# Patient Record
Sex: Female | Born: 1955 | Race: White | Hispanic: No | Marital: Married | Smoking: Former smoker
Health system: Southern US, Community
[De-identification: ages and names within clinical notes are randomized; demographics above are authoritative.]

## PROBLEM LIST (undated history)

## (undated) DIAGNOSIS — I10 Essential (primary) hypertension: Secondary | ICD-10-CM

## (undated) DIAGNOSIS — E785 Hyperlipidemia, unspecified: Secondary | ICD-10-CM

## (undated) HISTORY — DX: Hyperlipidemia, unspecified: E78.5

## (undated) HISTORY — PX: OTHER SURGICAL HISTORY: SHX169

## (undated) HISTORY — DX: Essential (primary) hypertension: I10

## (undated) HISTORY — PX: KNEE CARTILAGE SURGERY: SHX688

---

## 1993-02-14 HISTORY — PX: ABDOMINAL HYSTERECTOMY: SHX81

## 1997-02-14 HISTORY — PX: CHOLECYSTECTOMY: SHX55

## 2009-02-14 LAB — HM COLONOSCOPY

## 2010-07-08 ENCOUNTER — Ambulatory Visit (INDEPENDENT_AMBULATORY_CARE_PROVIDER_SITE_OTHER): Payer: 59 | Admitting: Family Medicine

## 2010-07-08 ENCOUNTER — Encounter: Payer: Self-pay | Admitting: Family Medicine

## 2010-07-08 DIAGNOSIS — Z0000 Encounter for general adult medical examination without abnormal findings: Secondary | ICD-10-CM | POA: Insufficient documentation

## 2010-07-08 DIAGNOSIS — I10 Essential (primary) hypertension: Secondary | ICD-10-CM

## 2010-07-08 LAB — LIPID PANEL
Cholesterol: 227 mg/dL — ABNORMAL HIGH (ref 0–200)
VLDL: 51.2 mg/dL — ABNORMAL HIGH (ref 0.0–40.0)

## 2010-07-08 LAB — CBC WITH DIFFERENTIAL/PLATELET
Basophils Relative: 0.6 % (ref 0.0–3.0)
Eosinophils Absolute: 0.2 10*3/uL (ref 0.0–0.7)
Eosinophils Relative: 2.3 % (ref 0.0–5.0)
Hemoglobin: 13.8 g/dL (ref 12.0–15.0)
Lymphocytes Relative: 36.7 % (ref 12.0–46.0)
MCHC: 35.4 g/dL (ref 30.0–36.0)
Monocytes Relative: 7.7 % (ref 3.0–12.0)
Neutro Abs: 3.8 10*3/uL (ref 1.4–7.7)
Neutrophils Relative %: 52.7 % (ref 43.0–77.0)
RBC: 4.31 Mil/uL (ref 3.87–5.11)
WBC: 7.2 10*3/uL (ref 4.5–10.5)

## 2010-07-08 LAB — BASIC METABOLIC PANEL
BUN: 15 mg/dL (ref 6–23)
CO2: 28 mEq/L (ref 19–32)
Chloride: 105 mEq/L (ref 96–112)
Creatinine, Ser: 0.6 mg/dL (ref 0.4–1.2)
Glucose, Bld: 108 mg/dL — ABNORMAL HIGH (ref 70–99)
Potassium: 4.4 mEq/L (ref 3.5–5.1)

## 2010-07-08 LAB — HEPATIC FUNCTION PANEL
ALT: 29 U/L (ref 0–35)
AST: 24 U/L (ref 0–37)
Albumin: 4.2 g/dL (ref 3.5–5.2)
Total Protein: 7.2 g/dL (ref 6.0–8.3)

## 2010-07-08 LAB — TSH: TSH: 1.01 u[IU]/mL (ref 0.35–5.50)

## 2010-07-08 LAB — LDL CHOLESTEROL, DIRECT: Direct LDL: 159.6 mg/dL

## 2010-07-08 MED ORDER — HYDROCHLOROTHIAZIDE 12.5 MG PO CAPS: 12.5000 mg | ORAL_CAPSULE | Freq: Every day | ORAL | Status: DC

## 2010-07-08 NOTE — Patient Instructions (Signed)
Follow up in 1 month to recheck blood pressure We'll notify you of your lab results Start the HCTZ daily for blood pressure Increase your water intake Try and limit your salt Call with any questions or concerns HAVE A WONDERFUL TRIP!!!

## 2010-07-08 NOTE — Progress Notes (Signed)
  Subjective:    Patient ID: Sherry Ross, female    DOB: [birth year], 55 y.o.   MRN: [id number]  HPI New to establish.  Previous MD- [facility].  GYN- [facility].  UTD on pap/mammo.  Colonoscopy 2011.  Elevated BP- pt reports this has been borderline for ~1 yr.  BP at recent health fair was 'elevated in 1 arm but normal in the other'.  Hyperlipidemia- was attempting to control w/ diet and exercise but she feels 'this didn't pan out'.   Review of Systems Patient reports no vision/ hearing  changes, adenopathy,fever, weight change,  persistant / recurrent hoarseness , swallowing issues, chest pain,palpitations,edema,persistant /recurrent cough, hemoptysis, dyspnea( rest/ exertional/paroxysmal nocturnal), gastrointestinal bleeding(melena, rectal bleeding), abdominal pain, significant heartburn, bowel changes,GU symptoms(dysuria, hematuria,pyuria, incontinence) ), Gyn symptoms(abnormal  bleeding , pain),  syncope, focal weakness, memory loss,numbness & tingling, skin/hair /nail changes,abnormal bruising or bleeding, anxiety,or depression.     Objective:   Physical Exam  General Appearance:    Alert, cooperative, no distress, appears stated age  Head:    Normocephalic, without obvious abnormality, atraumatic  Eyes:    PERRL, conjunctiva/corneas clear, EOM's intact, fundi    benign, both eyes  Ears:    Normal TM's and external ear canals, both ears  Nose:   Nares normal, septum midline, mucosa normal, no drainage    or sinus tenderness  Throat:   Lips, mucosa, and tongue normal; teeth and gums normal  Neck:   Supple, symmetrical, trachea midline, no adenopathy;    Thyroid: no enlargement/tenderness/nodules  Back:     Symmetric, no curvature, ROM normal, no CVA tenderness  Lungs:     Clear to auscultation bilaterally, respirations unlabored  Chest Wall:    No tenderness or deformity   Heart:    Regular rate and rhythm, S1 and S2 normal, no murmur, rub   or gallop  Breast Exam:    Deferred to  GYN  Abdomen:     Soft, non-tender, bowel sounds active all four quadrants,    no masses, no organomegaly  Genitalia:    Deferred to gyn     Extremities:   Extremities normal, atraumatic, no cyanosis or edema  Pulses:   2+ and symmetric all extremities  Skin:   Skin color, texture, turgor normal, no rashes or lesions  Lymph nodes:   Cervical, supraclavicular, and axillary nodes normal  Neurologic:   CNII-XII intact, normal strength, sensation and reflexes    throughout          Assessment & Plan:

## 2010-07-09 ENCOUNTER — Encounter: Payer: Self-pay | Admitting: *Deleted

## 2010-07-09 ENCOUNTER — Telehealth: Payer: Self-pay | Admitting: *Deleted

## 2010-07-09 MED ORDER — ATORVASTATIN CALCIUM 10 MG PO TABS: 10.0000 mg | ORAL_TABLET | Freq: Every day | ORAL | Status: DC

## 2010-07-09 NOTE — Telephone Encounter (Addendum)
Discuss with patient  Message copied by Verdene Rio on Fri Jul 09, 2010  2:10 PM ------      Message from: Sheliah Hatch      Created: Thu Jul 08, 2010  4:59 PM       Total cholesterol, LDL, triglycerides are all elevated.  This will improve w/ healthy diet and regular exercise but in addition to this should start Lipitor 10mg  nightly and recheck LFTs 8 weeks after starting meds.  (can start this after she returns from [place])            Sugar is also mildly elevated.  This will also improve w/ attention to diet and exercise.

## 2010-07-10 LAB — VITAMIN D 1,25 DIHYDROXY
Vitamin D2 1, 25 (OH)2: <8 pg/mL
Vitamin D3 1, 25 (OH)2: 55 pg/mL

## 2010-07-13 ENCOUNTER — Encounter: Payer: Self-pay | Admitting: *Deleted

## 2010-07-14 NOTE — Telephone Encounter (Signed)
Please let her know that due to oral surgery i cannot call her (she would not be able to understand me over the phone).  Please tell her that she can relay her problem to our nursing staff and then we will help her from there.

## 2010-07-14 NOTE — Telephone Encounter (Signed)
Pt aware and has since filled Rx and started med.

## 2010-07-14 NOTE — Telephone Encounter (Signed)
Muscle pain and breakdown is a very rare side effect and unlikely w/ such a low dose of medicine.  She can try OTC fish oil and red yeast rice to treat her trigs and cholesterol respectively along w/ attention to diet and regular exercise.  Will recheck labs in 6 months- if still elevated will need prescription meds.

## 2010-07-14 NOTE — Telephone Encounter (Signed)
Pt states that she is concern about med causing breakdown in muscle and pain. Pt would like to know if there are any other alternative that she can try that do not have so many side effects.Please advise

## 2010-07-14 NOTE — Telephone Encounter (Signed)
Left message to call office

## 2010-07-18 NOTE — Assessment & Plan Note (Signed)
BP elevation qualifies pt as hypertensive.  Start low dose HCTZ and recheck when pt return from vacation.

## 2010-07-18 NOTE — Assessment & Plan Note (Signed)
Pt's PE WNL  UTD on health maintenance.  Anticipatory guidance provided. 

## 2010-08-10 ENCOUNTER — Ambulatory Visit (INDEPENDENT_AMBULATORY_CARE_PROVIDER_SITE_OTHER): Payer: 59 | Admitting: Family Medicine

## 2010-08-10 ENCOUNTER — Encounter: Payer: Self-pay | Admitting: Family Medicine

## 2010-08-10 DIAGNOSIS — E785 Hyperlipidemia, unspecified: Secondary | ICD-10-CM

## 2010-08-10 DIAGNOSIS — I10 Essential (primary) hypertension: Secondary | ICD-10-CM

## 2010-08-10 DIAGNOSIS — M722 Plantar fascial fibromatosis: Secondary | ICD-10-CM | POA: Insufficient documentation

## 2010-08-10 LAB — BASIC METABOLIC PANEL
BUN: 16 mg/dL (ref 6–23)
Chloride: 105 mEq/L (ref 96–112)
Creatinine, Ser: 0.6 mg/dL (ref 0.4–1.2)
GFR: 116.62 mL/min (ref >60.00)
Potassium: 4.1 mEq/L (ref 3.5–5.1)

## 2010-08-10 LAB — HEPATIC FUNCTION PANEL
Alkaline Phosphatase: 67 U/L (ref 39–117)
Bilirubin, Direct: 0.1 mg/dL (ref 0.0–0.3)
Total Bilirubin: 0.9 mg/dL (ref 0.3–1.2)
Total Protein: 6.9 g/dL (ref 6.0–8.3)

## 2010-08-10 MED ORDER — NAPROXEN 500 MG PO TABS: 500.0000 mg | ORAL_TABLET | Freq: Two times a day (BID) | ORAL | Status: DC

## 2010-08-10 NOTE — Assessment & Plan Note (Signed)
BP much better controlled today.  Asymptomatic.  No side effects from meds.  No changes at this time.

## 2010-08-10 NOTE — Progress Notes (Signed)
  Subjective:    Patient ID: Sherry Ross, female    DOB: 06-26-1955, 55 y.o.   MRN: [id number]  HPI HTN- new problem for pt, currently on HCTZ.  No complaints about med.  Denies CP, SOB, HAs, visual changes, edema.  Hyperlipidemia- new problem for pt, started on Lipitor.  No complaints re meds.  No N/V, abd pain, myalgias  Bilateral heel pain- L>R, worse in flat shoes and w/ 1st steps.  sxs started 'quite some time' ago but has recently worsened.   Review of Systems For ROS see HPI    Objective:   Physical Exam  Constitutional: She is oriented to person, place, and time. She appears well-developed and well-nourished. No distress.  HENT:  Head: Normocephalic and atraumatic.  Eyes: Conjunctivae and EOM are normal. Pupils are equal, round, and reactive to light.  Neck: Normal range of motion. Neck supple. No thyromegaly present.  Cardiovascular: Normal rate, regular rhythm, normal heart sounds and intact distal pulses.   No murmur heard. Pulmonary/Chest: Effort normal and breath sounds normal. No respiratory distress.  Abdominal: Soft. She exhibits no distension. There is no tenderness.  Musculoskeletal: She exhibits no edema.       No TTP over insertion of plantar fascia bilaterally Normal longitudinal arches bilaterally w/ minimal flattening w/ weight bearing.  Lymphadenopathy:    She has no cervical adenopathy.  Neurological: She is alert and oriented to person, place, and time.  Skin: Skin is warm and dry.  Psychiatric: She has a normal mood and affect. Her behavior is normal.          Assessment & Plan:

## 2010-08-10 NOTE — Assessment & Plan Note (Signed)
Pt reports this has been going on for ~6 months.  Will start NSAIDs, ice, stretching, and reviewed the importance of supportive footwear.  Reviewed supportive care and red flags that should prompt return.  Pt expressed understanding and is in agreement w/ plan.

## 2010-08-10 NOTE — Patient Instructions (Signed)
Follow up in 6 months to recheck cholesterol and blood pressure (do not eat before this appt) ICE and stretch your heels at least 2-3x/day Take the Naproxen regularly (with food!) for the next 10 days and then as needed Continue to wear cushioned, supportive shoes If no improvement in the next 2 weeks- call me! We'll notify you of your lab results Have a great summer!

## 2010-08-10 NOTE — Assessment & Plan Note (Signed)
Tolerating statin w/out difficulty.  Check LFTs.

## 2010-08-11 ENCOUNTER — Other Ambulatory Visit (HOSPITAL_COMMUNITY)
Admission: RE | Admit: 2010-08-11 | Discharge: 2010-08-11 | Disposition: A | Discharge status: Home / Self Care | Payer: 59 | Source: Ambulatory Visit | Attending: Obstetrics and Gynecology | Admitting: Obstetrics and Gynecology

## 2010-08-11 ENCOUNTER — Encounter: Payer: Self-pay | Admitting: *Deleted

## 2010-08-11 ENCOUNTER — Other Ambulatory Visit: Payer: Self-pay

## 2010-08-11 DIAGNOSIS — Z01419 Encounter for gynecological examination (general) (routine) without abnormal findings: Secondary | ICD-10-CM | POA: Insufficient documentation

## 2010-09-13 ENCOUNTER — Telehealth: Payer: Self-pay | Admitting: Family Medicine

## 2010-09-13 NOTE — Telephone Encounter (Signed)
Pt notes that she had hysterectomy in 1995 but ovary were left. Pt has not had any form of bleeding since. Pt c/o of some spotting on yesterday but has since resolved. Pt denies any abdominal pain,tenderness or swelling. .Please advise

## 2010-09-14 NOTE — Telephone Encounter (Signed)
Discuss with patient  

## 2010-09-14 NOTE — Telephone Encounter (Signed)
Needs to see someone in GYN practice for post menopausal bleeding

## 2010-09-14 NOTE — Telephone Encounter (Signed)
Left message to call office

## 2010-09-30 ENCOUNTER — Other Ambulatory Visit: Payer: Self-pay | Admitting: Family Medicine

## 2011-02-04 ENCOUNTER — Encounter: Payer: Self-pay | Admitting: Family Medicine

## 2011-02-04 ENCOUNTER — Ambulatory Visit (INDEPENDENT_AMBULATORY_CARE_PROVIDER_SITE_OTHER): Payer: 59 | Admitting: Family Medicine

## 2011-02-04 DIAGNOSIS — G4730 Sleep apnea, unspecified: Secondary | ICD-10-CM | POA: Insufficient documentation

## 2011-02-04 DIAGNOSIS — I10 Essential (primary) hypertension: Secondary | ICD-10-CM

## 2011-02-04 DIAGNOSIS — E785 Hyperlipidemia, unspecified: Secondary | ICD-10-CM

## 2011-02-04 LAB — BASIC METABOLIC PANEL
BUN: 14 mg/dL (ref 6–23)
CO2: 26 mEq/L (ref 19–32)
Chloride: 103 mEq/L (ref 96–112)
Creatinine, Ser: 0.5 mg/dL (ref 0.4–1.2)
Glucose, Bld: 112 mg/dL — ABNORMAL HIGH (ref 70–99)
Potassium: 3.9 mEq/L (ref 3.5–5.1)

## 2011-02-04 LAB — LIPID PANEL
Cholesterol: 158 mg/dL (ref 0–200)
LDL Cholesterol: 72 mg/dL (ref 0–99)
Triglycerides: 175 mg/dL — ABNORMAL HIGH (ref 0.0–149.0)

## 2011-02-04 LAB — HEPATIC FUNCTION PANEL
ALT: 28 U/L (ref 0–35)
AST: 26 U/L (ref 0–37)
Total Bilirubin: 0.8 mg/dL (ref 0.3–1.2)
Total Protein: 7.3 g/dL (ref 6.0–8.3)

## 2011-02-04 NOTE — Assessment & Plan Note (Signed)
Chronic problem.  Tolerating statin w/out difficulty.  Check labs.  Adjust meds prn  

## 2011-02-04 NOTE — Assessment & Plan Note (Signed)
New.  Based on pt's reported sxs sounds highly suspicious for apnea.  Refer to pulm for complete evaluation.  Pt expressed understanding and is in agreement w/ plan.

## 2011-02-04 NOTE — Patient Instructions (Signed)
Schedule your complete physical in 6 months We'll notify you of your lab results and make any changes if needed Someone will call you with your Pulmonary Appt Call with any questions or concerns Happy Holidays!!!!

## 2011-02-04 NOTE — Assessment & Plan Note (Signed)
Chronic problem.  Well controlled today.  Asymptomatic.  No changes. 

## 2011-02-04 NOTE — Progress Notes (Signed)
  Subjective:    Patient ID: Sherry Ross, female    DOB: [birth year], 55 y.o.   MRN: [id number]  HPI HTN- chronic problem, on HCTZ.  No CP, SOB, HAs, visual changes, edema.  Hyperlipidemia- Chronic problem.  On Lipitor.  Occasionally forgetting to take med at night.  Denies N/V, abd pain, myalgias.  ? Sleep apnea- husband is reporting loud snoring, long pauses between breaths.  Will wake frequently at night.  Husband reports 'gasping' while sleeping.   Review of Systems For ROS see HPI     Objective:   Physical Exam  Vitals reviewed. Constitutional: She is oriented to person, place, and time. She appears well-developed and well-nourished. No distress.  HENT:  Head: Normocephalic and atraumatic.  Eyes: Conjunctivae and EOM are normal. Pupils are equal, round, and reactive to light.  Neck: Normal range of motion. Neck supple. No thyromegaly present.  Cardiovascular: Normal rate, regular rhythm, normal heart sounds and intact distal pulses.   No murmur heard. Pulmonary/Chest: Effort normal and breath sounds normal. No respiratory distress.  Abdominal: Soft. She exhibits no distension. There is no tenderness.  Musculoskeletal: She exhibits no edema.  Lymphadenopathy:    She has no cervical adenopathy.  Neurological: She is alert and oriented to person, place, and time.  Skin: Skin is warm and dry.  Psychiatric: She has a normal mood and affect. Her behavior is normal.          Assessment & Plan:

## 2011-03-01 ENCOUNTER — Institutional Professional Consult (permissible substitution): Payer: 59 | Admitting: Pulmonary Disease

## 2011-03-03 ENCOUNTER — Encounter: Payer: Self-pay | Admitting: Pulmonary Disease

## 2011-03-03 ENCOUNTER — Ambulatory Visit (INDEPENDENT_AMBULATORY_CARE_PROVIDER_SITE_OTHER): Payer: 59 | Admitting: Pulmonary Disease

## 2011-03-03 VITALS — BP 118/86 | HR 69 | Temp 98.3°F | Ht 65.5 in | Wt 203.8 lb

## 2011-03-03 DIAGNOSIS — G4730 Sleep apnea, unspecified: Secondary | ICD-10-CM

## 2011-03-03 NOTE — Patient Instructions (Signed)
Sleep study

## 2011-03-03 NOTE — Assessment & Plan Note (Signed)
Given excessive daytime somnolence, narrow pharyngeal exam, witnessed apneas & loud snoring, obstructive sleep apnea is very likely & an overnight polysomnogram will be scheduled as a split study. The witnessed gasping episodes are a red flag.  The pathophysiology of obstructive sleep apnea , it's cardiovascular consequences & modes of treatment including CPAP were discused with the patient in detail & they evidenced understanding. Home sleep study can also be performed but prefer in lab study due to frequent awakenings & to detect comorbid issues.

## 2011-03-03 NOTE — Progress Notes (Signed)
  Subjective:    Patient ID: Sherry Ross, female    DOB: Apr 21, 1955, 56 y.o.   MRN: [id number]  HPI 56/F, hypertensive woman for evaluation of obstructive sleep apnea . 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  c/o waking up often, trouble breathing while asleep Her husband has witnessed gasping episodes during sleep. ESS 10/24 but denies daily naps. She has always taken a nap on Sunday afternoons. Bedtime 9-1030p, latency 15 mins, sleeps on her side x 1 pillow with feet elevated, 5 awakenings , no nocturia, oon at 0600 rested mostly , dry mouth . She reports headache x 2 weeks & wonders if this could be due to her new icomfort mattress & pilllow. Her wt has noly fluctuated 10 lbs over the last 2 yrs. She drinks 1 cup coffee in am & about 32 ox of unsweetened tea daily. There is no history suggestive of cataplexy, sleep paralysis or parasomnias    Review of Systems  Constitutional: Negative for fever and unexpected weight change.  HENT: Negative for ear pain, nosebleeds, congestion, sore throat, rhinorrhea, sneezing, trouble swallowing, dental problem, postnasal drip and sinus pressure.   Eyes: Negative for redness and itching.  Respiratory: Negative for cough, chest tightness, shortness of breath and wheezing.   Cardiovascular: Negative for palpitations and leg swelling.  Gastrointestinal: Negative for nausea and vomiting.  Genitourinary: Negative for dysuria.  Musculoskeletal: Negative for joint swelling.  Skin: Negative for rash.  Neurological: Positive for headaches.  Hematological: Does not bruise/bleed easily.  Psychiatric/Behavioral: Negative for dysphoric mood. The patient is not nervous/anxious.        Objective:   Physical Exam  Gen. Pleasant, obese, in no distress, normal affect ENT - no lesions, no post nasal drip, class 2-3 airway Neck: No JVD, no thyromegaly, no carotid bruits Lungs: no use of accessory muscles, no dullness to percussion, decreased without rales or  rhonchi  Cardiovascular: Rhythm regular, heart sounds  normal, no murmurs or gallops, no peripheral edema Abdomen: soft and non-tender, no hepatosplenomegaly, BS normal. Musculoskeletal: No deformities, no cyanosis or clubbing Neuro:  alert, non focal, no tremors       Assessment & Plan:

## 2011-03-07 ENCOUNTER — Telehealth: Payer: Self-pay | Admitting: Family Medicine

## 2011-03-07 NOTE — Telephone Encounter (Signed)
Patient states that she threw out her "check out" sheet that had her activation code for mychart. She would like that activation code so she can sign up for mychart.

## 2011-03-07 NOTE — Telephone Encounter (Signed)
Called pt to advise activation code [phone] so pt can access my chart. Pt understood also gave number for assistance if needed

## 2011-03-09 ENCOUNTER — Other Ambulatory Visit: Payer: Self-pay | Admitting: Family Medicine

## 2011-03-09 MED ORDER — HYDROCHLOROTHIAZIDE 12.5 MG PO CAPS: 12.5000 mg | ORAL_CAPSULE | Freq: Every day | ORAL | Status: DC

## 2011-03-09 NOTE — Telephone Encounter (Signed)
rx sent to pharmacy by e-script  

## 2011-03-15 ENCOUNTER — Institutional Professional Consult (permissible substitution): Payer: 59 | Admitting: Pulmonary Disease

## 2011-03-20 ENCOUNTER — Ambulatory Visit (HOSPITAL_BASED_OUTPATIENT_CLINIC_OR_DEPARTMENT_OTHER): Payer: 59 | Attending: Pulmonary Disease | Admitting: Radiology

## 2011-03-20 VITALS — Ht 66.0 in | Wt 199.0 lb

## 2011-03-20 DIAGNOSIS — G4730 Sleep apnea, unspecified: Secondary | ICD-10-CM

## 2011-03-20 DIAGNOSIS — G4733 Obstructive sleep apnea (adult) (pediatric): Secondary | ICD-10-CM | POA: Insufficient documentation

## 2011-03-24 DIAGNOSIS — G4733 Obstructive sleep apnea (adult) (pediatric): Secondary | ICD-10-CM

## 2011-03-25 NOTE — Procedures (Signed)
Sherry Ross, Sherry Ross              ACCOUNT NO.:  [id number]  MEDICAL RECORD NO.:  [id number]          PATIENT TYPE:  OUT  LOCATION:  SLEEP CENTER                 FACILITY:  [facility]  PHYSICIAN:  Oretha Milch, MD      DATE OF BIRTH:  1955/05/11  DATE OF STUDY:  03/20/2011                           NOCTURNAL POLYSOMNOGRAM  REFERRING PHYSICIAN:  Oretha Milch, MD  INDICATION FOR THE STUDY:  Excessive daytime somnolence, restless and nonrefreshing sleep and snoring in this 56 year old woman with hypertension.  At the time of the study, she weighed 199 pounds with a height of 5 feet 6 inches, BMI of 32, neck size of 14 inches.  Epworth sleepiness score was 9.  This nocturnal polysomnogram was performed with a sleep technologist in attendance.  EEG, EOG, EMG, EKG, and respiratory parameters were recorded.  Sleep stages, arousals, limb movements, and respiratory data were scored according to criteria laid out by the American Academy of Sleep Medicine.  SLEEP ARCHITECTURE:  Lights out was at 9:50 p.m., lights on was at 4:54 a.m.  Total sleep time was 389 minutes with a sleep period time of 416 minutes of sleep.  Sleep efficiency of 92%.  Sleep latency was 6.5 minutes.  Sleep latency to REM sleep was 85 minutes.  Sleep stages of the percentage of total sleep time was N1 3.5%, N2 73%, N3 0%, REM sleep 23% (91 minutes).  Supine sleep accounted for 231 minutes and supine REM sleep accounted for 49 minutes.  Good REM progression was noted with 4 periods of REM sleep.  AROUSAL DATA:  There were total of 179 arousals with an arousal index of 28 events per hour.  Of these 160 were spontaneous and the rest were associated with respiratory events.  RESPIRATORY DATA:  There were total of 1 obstructive apnea, 0 central apneas, 0 mixed apneas, and 51 hypopneas with an apnea/hypopnea index of 8 events per hour, 37 RERAs were noted with an RDI of 13.7 events per hour.  Most events were noted during REM  sleep.  LIMB MOVEMENT DATA:  Limb movement index was 0.8 events per hour.  OXYGEN SATURATION DATA:  The desaturation index was 8.6 events per hour. The lowest desaturation was 85% during REM sleep.  She spent 1.1 minute with a saturation less than 88%.  CARDIAC DATA:  The low heart rate was 35 beats per minute.  The high heart rate was 114 beats per minute.  No arrhythmias were noted.  DISCUSSION:  Good REM progression, absence of slow wave sleep.  She was desensitized with a small full face mask but did not meet criteria for split intervention.  IMPRESSION: 1. Mild obstructive sleep apnea with predominant hypopneas during REM     sleep causing sleep fragmentation and oxygen desaturation 2. Large number of spontaneous arousals causing sleep fragmentation. 3. No evidence of cardiac arrhythmias, significant limb movements, or     behavioral disturbance during sleep.  RECOMMENDATION: 1. Treatment options for this degree of sleep-disordered breathing     include weight loss, oral appliance, or CPAP therapy 2. She should be advised against medication sedative side effects,     should be cautioned against  driving when sleepy.     Oretha Milch, MD    RVA/MEDQ  D:  03/24/2011 08:02:45  T:  03/25/2011 00:51:39  Job:  [id number]

## 2011-04-05 ENCOUNTER — Ambulatory Visit (INDEPENDENT_AMBULATORY_CARE_PROVIDER_SITE_OTHER): Payer: 59 | Admitting: Pulmonary Disease

## 2011-04-05 ENCOUNTER — Encounter: Payer: Self-pay | Admitting: Pulmonary Disease

## 2011-04-05 VITALS — BP 108/82 | HR 60 | Temp 97.4°F | Ht 66.0 in | Wt 200.6 lb

## 2011-04-05 DIAGNOSIS — G4730 Sleep apnea, unspecified: Secondary | ICD-10-CM

## 2011-04-05 NOTE — Assessment & Plan Note (Addendum)
Treatment options for this degree of sleep-disordered breathing include weight loss, oral appliance, or CPAP therapy  We will initiate autocpap 5-12 cm with small full face mask & chk download  Weight loss encouraged, compliance with goal of at least 4-6 hrs every night is the expectation. Advised against medications with sedative side effects Cautioned against driving when sleepy - understanding that sleepiness will vary on a day to day basis

## 2011-04-05 NOTE — Progress Notes (Signed)
  Subjective:    Patient ID: Sherry Ross, female    DOB: August 27, 1955, 56 y.o.   MRN: [id number]  HPI 55/F, hypertensive woman for evaluation of obstructive sleep apnea .  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  c/o waking up often, trouble breathing while asleep  Her husband has witnessed gasping episodes during sleep.  ESS 10/24 but denies daily naps. She has always taken a nap on Sunday afternoons.  Bedtime 9-1030p, latency 15 mins, sleeps on her side x 1 pillow with feet elevated, 5 awakenings , no nocturia, oon at 0600 rested mostly , dry mouth . She reports headache x 2 weeks & wonders if this could be due to her new icomfort mattress & pilllow.  Her wt has noly fluctuated 10 lbs over the last 2 yrs. She drinks 1 cup coffee in am & about 32 ox of unsweetened tea daily.   At the time of the study, she weighed 199 pounds with a height of 5 feet 6 inches, BMI of 32, neck size of 14 inches. There were total of 1 obstructive apnea, 0 central apneas, 0 mixed apneas, and 51 hypopneas with an apnea/hypopnea index of 8 events per hour, 37 RERAs were noted with an RDI of 13.7 events per  hour. Most events were noted during REM sleep.Good REM progression, absence of slow wave sleep. She was desensitized with a small full face mask      Review of Systems Patient denies significant dyspnea,cough, hemoptysis,  chest pain, palpitations, pedal edema, orthopnea, paroxysmal nocturnal dyspnea, lightheadedness, nausea, vomiting, abdominal or  leg pains      Objective:   Physical Exam  Gen. Pleasant, well-nourished, in no distress ENT - no lesions, no post nasal drip Neck: No JVD, no thyromegaly, no carotid bruits Lungs: no use of accessory muscles, no dullness to percussion, clear without rales or rhonchi  Cardiovascular: Rhythm regular, heart sounds  normal, no murmurs or gallops, no peripheral edema Musculoskeletal: No deformities, no cyanosis or clubbing        Assessment & Plan:

## 2011-04-05 NOTE — Patient Instructions (Signed)
We discussed treatment for mild obstructive sleep apnea We will start you on a CPAP machine

## 2011-05-04 ENCOUNTER — Telehealth: Payer: Self-pay | Admitting: *Deleted

## 2011-05-04 NOTE — Telephone Encounter (Signed)
Pt states that she is going to get some permanent make-up apply to her face. Pt indicated that the person doing the procedure suggested that she get Rx from her physician for prilocaine which is local/topical numbing agent.Please advise

## 2011-05-05 MED ORDER — LIDOCAINE-PRILOCAINE 2.5-2.5 % EX CREA: TOPICAL_CREAM | CUTANEOUS | Status: DC | PRN

## 2011-05-05 NOTE — Telephone Encounter (Signed)
Prilocaine 5g   As directed  No refills

## 2011-05-05 NOTE — Telephone Encounter (Signed)
Rx faxed and patient made aware     KP 

## 2011-05-30 ENCOUNTER — Ambulatory Visit: Payer: 59 | Admitting: Pulmonary Disease

## 2011-06-01 ENCOUNTER — Telehealth: Payer: Self-pay | Admitting: Family Medicine

## 2011-06-01 NOTE — Telephone Encounter (Signed)
Spoke to patient regarding [facility] letter received regarding documents from Chart. Advised patient (pre Renee/Referrals) we do not show any records of documentation sent from this location as pulmonary doesn't require prior authorization. Per Renee the sleep study requested by pulmonary does require a PA, so I advised patient to call there and see if they did send a request for PA w/o a cover letter. Advised patient to call back here if no resolution.

## 2011-06-28 ENCOUNTER — Ambulatory Visit (INDEPENDENT_AMBULATORY_CARE_PROVIDER_SITE_OTHER): Payer: 59 | Admitting: Internal Medicine

## 2011-06-28 ENCOUNTER — Encounter: Payer: Self-pay | Admitting: Internal Medicine

## 2011-06-28 VITALS — BP 130/88 | HR 59 | Temp 98.2°F | Wt 190.0 lb

## 2011-06-28 DIAGNOSIS — R197 Diarrhea, unspecified: Secondary | ICD-10-CM

## 2011-06-28 MED ORDER — CIPROFLOXACIN HCL 500 MG PO TABS: 500.0000 mg | ORAL_TABLET | Freq: Two times a day (BID) | ORAL | Status: DC

## 2011-06-28 NOTE — Progress Notes (Signed)
  Subjective:    Patient ID: Sherry Ross, female    DOB: 1955-08-22, 56 y.o.   MRN: [id number]  HPI Acute visit 3-4 days history of diarrhea described as watery, explosive, denies any blood or mucus. She came back 2 days ago from a ten-day road trip in her motor home. Her husband  has no symptoms like she does.   Past Medical History  Diagnosis Date  . Hyperlipidemia   . Hypertension    Past Surgical History  Procedure Date  . Cholecystectomy 1999  . Abdominal hysterectomy 1995      Review of Systems No fever or chills, no nausea or vomiting. Appetite is fine. On and off upper abdominal pain, not triggered by food but with certain movements. Reports a colonoscopy a couple of years ago which was essentially okay. Reports not abx use in the recent past    Objective:   Physical Exam  General -- alert, well-developed. No apparent distress.  Lungs -- normal respiratory effort, no intercostal retractions, no accessory muscle use, and normal breath sounds.   Heart-- normal rate, regular rhythm, no murmur, and no gallop.   Abdomen--soft, non-tender, no distention, no masses, no HSM, no guarding, and no rigidity.   Extremities-- no pretibial edema bilaterally  Neurologic-- alert & oriented X3 and strength normal in all extremities. Psych-- Cognition and judgment appear intact. Alert and cooperative with normal attention span and concentration.  not anxious appearing and not depressed appearing.       Assessment & Plan:   Acute diarrhea, Acute diarrhea following a ten-day trip, no red flag symptoms. Traveler's diarrhea?. Will treat conservatively, if she's not better in 3-4 days we'll start Cipro. See instructions

## 2011-06-28 NOTE — Patient Instructions (Signed)
Drink plenty of fluids, bland diet (chicken soup, rice, crackers) If you're not better by Friday, start an antibiotic called Cipro. Call anytime if you have severe symptoms, fever chills, stomach pain or blood in the stools.

## 2011-07-19 ENCOUNTER — Telehealth: Payer: Self-pay | Admitting: Family Medicine

## 2011-07-19 NOTE — Telephone Encounter (Signed)
noted 

## 2011-07-19 NOTE — Telephone Encounter (Signed)
Patient called and stated she was going to [place] & wants to come in to get a Hep A Injection. I put her on the nurses schedule for tomorrow. If this needs an order please let me know otherwise this is just a [place]

## 2011-07-20 ENCOUNTER — Ambulatory Visit (INDEPENDENT_AMBULATORY_CARE_PROVIDER_SITE_OTHER): Payer: 59 | Admitting: *Deleted

## 2011-07-20 DIAGNOSIS — Z23 Encounter for immunization: Secondary | ICD-10-CM

## 2011-10-03 ENCOUNTER — Other Ambulatory Visit: Payer: Self-pay | Admitting: Family Medicine

## 2011-10-03 NOTE — Telephone Encounter (Signed)
Yes.  Pt was supposed to have complete physical in June.  Ok to refill but pt needs to schedule CPE

## 2011-10-03 NOTE — Telephone Encounter (Signed)
Refill done.  

## 2011-10-03 NOTE — Telephone Encounter (Signed)
Is pt still suppose to be taking this med? Please advise.

## 2011-11-03 ENCOUNTER — Other Ambulatory Visit: Payer: Self-pay | Admitting: Family Medicine

## 2011-11-03 MED ORDER — ATORVASTATIN CALCIUM 10 MG PO TABS: 10.0000 mg | ORAL_TABLET | Freq: Every day | ORAL | Status: DC

## 2011-11-03 MED ORDER — HYDROCHLOROTHIAZIDE 12.5 MG PO CAPS: 12.5000 mg | ORAL_CAPSULE | Freq: Every day | ORAL | Status: DC

## 2011-11-03 NOTE — Telephone Encounter (Signed)
Hydrochlororthiazide 12.5 mg cp Qty:30 Last fill: 10/03/11 Take 1 capsule (12.5 mg total) by mouth daily.

## 2011-11-03 NOTE — Telephone Encounter (Signed)
Atorvastatin 10 mg  Tablet Qty:30 Last refill: 10/03/11 Take 1 tablet everyday (appt due)

## 2011-11-03 NOTE — Telephone Encounter (Signed)
rx sent to pharmacy by e-script  

## 2011-11-04 ENCOUNTER — Telehealth: Payer: Self-pay | Admitting: Family Medicine

## 2011-11-04 NOTE — Telephone Encounter (Signed)
Pt called and stated that the pharm told her she was do for an office visit. She states that she had labs done recently from [facility] ? She would like someone to call her to discuss, amym

## 2011-11-04 NOTE — Telephone Encounter (Signed)
Pt was due for a CPE around June per last ov note.  [place] will schedule and assured pt she could get refills to last until her appt.

## 2011-12-06 ENCOUNTER — Other Ambulatory Visit: Payer: Self-pay | Admitting: Family Medicine

## 2011-12-06 MED ORDER — ATORVASTATIN CALCIUM 10 MG PO TABS: 10.0000 mg | ORAL_TABLET | Freq: Every day | ORAL | Status: DC

## 2011-12-06 MED ORDER — HYDROCHLOROTHIAZIDE 12.5 MG PO CAPS: 12.5000 mg | ORAL_CAPSULE | Freq: Every day | ORAL | Status: DC

## 2011-12-06 NOTE — Telephone Encounter (Signed)
rx sent to pharmacy by e-script to last pt til upcoming OV scheduled

## 2011-12-06 NOTE — Telephone Encounter (Signed)
refill hydrochlorothiazide 12.5mg  cp #30 take one capsule by mouth daily --last fill 9.19.13--last ov 05.14.13 acute wt/paz CPE scheduled for 11.19.13

## 2011-12-06 NOTE — Telephone Encounter (Signed)
PLEASE ALSO ADD Atorvastatin 10mg  tablet #30 take 1 tablet by mouth daily last fill 9.19.13

## 2012-01-03 ENCOUNTER — Encounter: Payer: Self-pay | Admitting: Family Medicine

## 2012-01-03 ENCOUNTER — Ambulatory Visit (INDEPENDENT_AMBULATORY_CARE_PROVIDER_SITE_OTHER): Payer: 59 | Admitting: Family Medicine

## 2012-01-03 VITALS — BP 124/76 | HR 84 | Temp 98.1°F | Ht 65.0 in | Wt 197.4 lb

## 2012-01-03 DIAGNOSIS — E785 Hyperlipidemia, unspecified: Secondary | ICD-10-CM

## 2012-01-03 DIAGNOSIS — N952 Postmenopausal atrophic vaginitis: Secondary | ICD-10-CM

## 2012-01-03 DIAGNOSIS — M722 Plantar fascial fibromatosis: Secondary | ICD-10-CM

## 2012-01-03 DIAGNOSIS — I10 Essential (primary) hypertension: Secondary | ICD-10-CM

## 2012-01-03 DIAGNOSIS — Z0000 Encounter for general adult medical examination without abnormal findings: Secondary | ICD-10-CM

## 2012-01-03 LAB — CBC WITH DIFFERENTIAL/PLATELET
Eosinophils Absolute: 0.2 10*3/uL (ref 0.0–0.7)
Lymphocytes Relative: 37 % (ref 12.0–46.0)
MCHC: 33.5 g/dL (ref 30.0–36.0)
MCV: 90.6 fl (ref 78.0–100.0)
Monocytes Absolute: 0.5 10*3/uL (ref 0.1–1.0)
Neutrophils Relative %: 52.9 % (ref 43.0–77.0)
Platelets: 225 10*3/uL (ref 150.0–400.0)
WBC: 7.2 10*3/uL (ref 4.5–10.5)

## 2012-01-03 LAB — BASIC METABOLIC PANEL
BUN: 18 mg/dL (ref 6–23)
Creatinine, Ser: 0.6 mg/dL (ref 0.4–1.2)
GFR: 107.3 mL/min (ref >60.00)
Potassium: 3.9 mEq/L (ref 3.5–5.1)

## 2012-01-03 LAB — LIPID PANEL
Cholesterol: 178 mg/dL (ref 0–200)
HDL: 46.9 mg/dL (ref >39.00)
VLDL: 32 mg/dL (ref 0.0–40.0)

## 2012-01-03 LAB — HEPATIC FUNCTION PANEL
AST: 24 U/L (ref 0–37)
Total Bilirubin: 1 mg/dL (ref 0.3–1.2)

## 2012-01-03 LAB — TSH: TSH: 1.5 u[IU]/mL (ref 0.35–5.50)

## 2012-01-03 MED ORDER — ESTROGENS, CONJUGATED 0.625 MG/GM VA CREA: TOPICAL_CREAM | Freq: Every day | VAGINAL | Status: DC

## 2012-01-03 MED ORDER — NAPROXEN 500 MG PO TABS: 500.0000 mg | ORAL_TABLET | Freq: Two times a day (BID) | ORAL | Status: DC

## 2012-01-03 MED ORDER — HYDROCHLOROTHIAZIDE 12.5 MG PO CAPS: 12.5000 mg | ORAL_CAPSULE | Freq: Every day | ORAL | Status: DC

## 2012-01-03 NOTE — Assessment & Plan Note (Signed)
Chronic problem.  Well controlled.  Asymptomatic.  No changes. 

## 2012-01-03 NOTE — Assessment & Plan Note (Signed)
Ongoing problem.  Pt to restart NSAIDs and ice- refer to podiatry.

## 2012-01-03 NOTE — Assessment & Plan Note (Signed)
New.  Pt w/ severe sxs by report.  Discussed use of vaginal estrogens as opposed to oral estrogens.  Reviewed increased risk of breast cancer- pt aware and accepts risk.  UTD on mammo.  Will start w/ low dose and follow closely.  Pt expressed understanding and is in agreement w/ plan.

## 2012-01-03 NOTE — Progress Notes (Signed)
  Subjective:    Patient ID: Sherry Ross, female    DOB: Sep 13, 1955, 56 y.o.   MRN: [id number]  HPI CPE- UTD on mammo, colonoscopy Toma Copier).  No need for paps due to TAH.  Plantar fasciitis- pt was seeing the foot center on [address], has had 2 steroid injxns- last being in July and only provided 2 months of relief.  Bought the braces but doesn't feel this is helpful.  1st step in the AM is extremely painful.  Pain improves w/ continued walking.   Review of Systems Patient reports no vision/ hearing changes, adenopathy,fever, weight change,  persistant/recurrent hoarseness , swallowing issues, chest pain, palpitations, edema, persistant/recurrent cough, hemoptysis, dyspnea (rest/exertional/paroxysmal nocturnal), gastrointestinal bleeding (melena, rectal bleeding), abdominal pain, significant heartburn, bowel changes, GU symptoms (dysuria, hematuria, incontinence),  syncope, focal weakness, memory loss, numbness & tingling, skin/hair/nail changes, abnormal bruising or bleeding, anxiety, or depression.   + atrophic vaginitis- pt reports skin is so thin and painful that she can tear w/ gentle wiping after urination.  Sex is particularly difficult and painful.  Has used OTC lubricants and would like other options.    Objective:   Physical Exam General Appearance:    Alert, cooperative, no distress, appears stated age  Head:    Normocephalic, without obvious abnormality, atraumatic  Eyes:    PERRL, conjunctiva/corneas clear, EOM's intact, fundi    benign, both eyes  Ears:    Normal TM's and external ear canals, both ears  Nose:   Nares normal, septum midline, mucosa normal, no drainage    or sinus tenderness  Throat:   Lips, mucosa, and tongue normal; teeth and gums normal  Neck:   Supple, symmetrical, trachea midline, no adenopathy;    Thyroid: no enlargement/tenderness/nodules  Back:     Symmetric, no curvature, ROM normal, no CVA tenderness  Lungs:     Clear to auscultation bilaterally,  respirations unlabored  Chest Wall:    No tenderness or deformity   Heart:    Regular rate and rhythm, S1 and S2 normal, no murmur, rub   or gallop  Breast Exam:    Deferred to mammo  Abdomen:     Soft, non-tender, bowel sounds active all four quadrants,    no masses, no organomegaly  Genitalia:    Deferred at pt's request  Rectal:    Extremities:   Extremities normal, atraumatic, no cyanosis or edema  Pulses:   2+ and symmetric all extremities  Skin:   Skin color, texture, turgor normal, no rashes or lesions  Lymph nodes:   Cervical, supraclavicular, and axillary nodes normal  Neurologic:   CNII-XII intact, normal strength, sensation and reflexes    throughout          Assessment & Plan:

## 2012-01-03 NOTE — Assessment & Plan Note (Signed)
Pt's PE WNL.  Check labs.  UTD on health maintenance.  Anticipatory guidance provided. 

## 2012-01-03 NOTE — Patient Instructions (Addendum)
Follow up in 6 months to recheck cholesterol and BP We'll notify you of your lab results and make any changes if needed We'll start the Premarin Cream daily for 2 weeks (start w/ 1 gram and decrease to 1/2 gram) and then decrease to 3x/week Someone will call you with your podiatry appt- resume icing and Naproxen for pain relief Call with any questions or concerns Happy Holidays!!!

## 2012-01-03 NOTE — Assessment & Plan Note (Signed)
Chronic problem.  Tolerating statin w/out difficulty.  Check labs.  Adjust meds prn  

## 2012-01-06 ENCOUNTER — Other Ambulatory Visit: Payer: Self-pay

## 2012-01-06 NOTE — Telephone Encounter (Signed)
Advise Pt to contact insurance company and give [place] a call back with preferred med.  Spoke with pharmacy as well who instructed that Pt will need to contact her insurance for cheaper alternative.

## 2012-01-06 NOTE — Telephone Encounter (Signed)
Pt called [facility] stating hormone cream that suppose to be inserted vaginally is going to cost $85 per month and she can't afford. Pt requesting an alternative cheaper med.  Plz advise pt   MW  CB# [phone]

## 2012-01-06 NOTE — Telephone Encounter (Signed)
Please call pt and/or pharmacy and ask if their insurance has a preferred option (pt may need to call insurance)

## 2012-01-23 ENCOUNTER — Telehealth: Payer: Self-pay | Admitting: Family Medicine

## 2012-01-23 DIAGNOSIS — E785 Hyperlipidemia, unspecified: Secondary | ICD-10-CM

## 2012-01-23 NOTE — Telephone Encounter (Signed)
Refill: Atorvastatin 10 mg tablet. Take 1 tablet by mouth daily. Qty 30. Last fill 12-06-11

## 2012-01-26 MED ORDER — ATORVASTATIN CALCIUM 10 MG PO TABS: 10.0000 mg | ORAL_TABLET | Freq: Every day | ORAL | Status: DC

## 2012-02-17 ENCOUNTER — Other Ambulatory Visit: Payer: Self-pay | Admitting: *Deleted

## 2012-02-17 DIAGNOSIS — E785 Hyperlipidemia, unspecified: Secondary | ICD-10-CM

## 2012-02-17 MED ORDER — ATORVASTATIN CALCIUM 10 MG PO TABS: 10.0000 mg | ORAL_TABLET | Freq: Every day | ORAL | Status: DC

## 2012-02-17 NOTE — Telephone Encounter (Signed)
Refill for atorvastatin sent to pharmacy #30 with 2 refills.

## 2012-05-04 ENCOUNTER — Encounter: Payer: Self-pay | Admitting: Family Medicine

## 2012-05-04 ENCOUNTER — Ambulatory Visit (INDEPENDENT_AMBULATORY_CARE_PROVIDER_SITE_OTHER): Payer: 59 | Admitting: Family Medicine

## 2012-05-04 VITALS — BP 118/70 | HR 82 | Temp 98.1°F | Ht 64.5 in | Wt 192.8 lb

## 2012-05-04 DIAGNOSIS — R350 Frequency of micturition: Secondary | ICD-10-CM

## 2012-05-04 DIAGNOSIS — F341 Dysthymic disorder: Secondary | ICD-10-CM

## 2012-05-04 DIAGNOSIS — N390 Urinary tract infection, site not specified: Secondary | ICD-10-CM | POA: Insufficient documentation

## 2012-05-04 DIAGNOSIS — F419 Anxiety disorder, unspecified: Secondary | ICD-10-CM | POA: Insufficient documentation

## 2012-05-04 DIAGNOSIS — F329 Major depressive disorder, single episode, unspecified: Secondary | ICD-10-CM | POA: Insufficient documentation

## 2012-05-04 LAB — POCT URINALYSIS DIPSTICK
Glucose, UA: NEGATIVE
Spec Grav, UA: 1.01
Urobilinogen, UA: 0.2

## 2012-05-04 MED ORDER — ESCITALOPRAM OXALATE 10 MG PO TABS: 10.0000 mg | ORAL_TABLET | Freq: Every day | ORAL | Status: DC

## 2012-05-04 MED ORDER — CEPHALEXIN 500 MG PO CAPS: 500.0000 mg | ORAL_CAPSULE | Freq: Two times a day (BID) | ORAL | Status: DC

## 2012-05-04 NOTE — Progress Notes (Signed)
  Subjective:    Patient ID: Sherry Ross, female    DOB: 1955-12-05, 57 y.o.   MRN: [id number]  HPI UTI- sxs started 'a couple days ago'.  + frequency, burning, urgency.  Took AZO yesterday at breakfast and lunch w/ some relief.  No fevers.  No back pain.  No hematuria.  + hesitancy and incomplete emptying.  Work stress/anxiety- husband is mentioning her mood.  Pt is frequently tearful, very stressed.  Not sleeping well.  Eating too much.  Considering meds.   Review of Systems For ROS see HPI     Objective:   Physical Exam  Vitals reviewed. Constitutional: She is oriented to person, place, and time. She appears well-developed and well-nourished. No distress.  Abdominal: Soft. She exhibits no distension. There is no tenderness (no suprapubic or CVA tenderness).  Neurological: She is alert and oriented to person, place, and time.  Skin: Skin is warm and dry.  Psychiatric: She has a normal mood and affect. Her behavior is normal. Judgment and thought content normal.          Assessment & Plan:

## 2012-05-04 NOTE — Assessment & Plan Note (Signed)
New.  sxs and UA consistent w/ infxn.  Start abx.  Reviewed supportive care and red flags that should prompt return.  Pt expressed understanding and is in agreement w/ plan.

## 2012-05-04 NOTE — Patient Instructions (Addendum)
Follow up in 4-6 weeks to recheck mood Take the Keflex twice daily x5 days Drink plenty of fluids to flush the bladder Start the Lexapro daily for the mood- if you find it makes you tired, take it in the evening Call with any questions or concerns Hang in there!!!

## 2012-05-04 NOTE — Assessment & Plan Note (Signed)
New.  Pt has had sxs for 'at least 2 months'.  Not improving w/ time.  Pt interested in starting meds to improve sxs.  Start Lexapro.  Will follow closely.

## 2012-05-08 ENCOUNTER — Telehealth: Payer: Self-pay | Admitting: *Deleted

## 2012-05-08 LAB — URINE CULTURE: Colony Count: >=100000

## 2012-05-08 MED ORDER — NITROFURANTOIN MONOHYD MACRO 100 MG PO CAPS: 100.0000 mg | ORAL_CAPSULE | Freq: Two times a day (BID) | ORAL | Status: DC

## 2012-05-08 NOTE — Telephone Encounter (Signed)
Rx sent to the pharmacy by e-script.//AB/CMA 

## 2012-05-08 NOTE — Telephone Encounter (Signed)
Message copied by Sherry Ross on Tue May 08, 2012  3:28 PM ------      Message from: Sheliah Hatch      Created: Tue May 08, 2012 11:26 AM       Pt's UTI is 2 bacteria- need to switch abx to Macrobid 100mg  bid x7 days ------

## 2012-07-11 ENCOUNTER — Other Ambulatory Visit: Payer: Self-pay | Admitting: Dermatology

## 2012-09-07 ENCOUNTER — Other Ambulatory Visit: Payer: Self-pay | Admitting: Family Medicine

## 2012-09-07 NOTE — Telephone Encounter (Signed)
Rx sent to the pharmacy by e-script and also noted that the pt is due OV to f/u on mood.//AB/CMA

## 2012-10-04 ENCOUNTER — Telehealth: Payer: Self-pay | Admitting: Family Medicine

## 2012-10-04 MED ORDER — HYDROCHLOROTHIAZIDE 12.5 MG PO CAPS: ORAL_CAPSULE | ORAL | Status: DC

## 2012-10-04 MED ORDER — ESCITALOPRAM OXALATE 10 MG PO TABS: 10.0000 mg | ORAL_TABLET | Freq: Every day | ORAL | Status: DC

## 2012-10-04 NOTE — Telephone Encounter (Signed)
[facility] stating that Rx are printed and ready for pick up

## 2012-10-04 NOTE — Telephone Encounter (Signed)
Ok for #30, 1 refill for each medication Remind pt that she is due for her CPE in Nov and needs to schedule

## 2012-10-04 NOTE — Telephone Encounter (Signed)
Patient states she will be traveling until 11/17/12 and she will need refills on her hctz and escitalopram. She is asking if we can print paper rx for both so she can get them filled wherever she is when she runs out. Please advise.

## 2012-11-20 ENCOUNTER — Ambulatory Visit (INDEPENDENT_AMBULATORY_CARE_PROVIDER_SITE_OTHER): Payer: 59 | Admitting: Family Medicine

## 2012-11-20 ENCOUNTER — Encounter: Payer: Self-pay | Admitting: Family Medicine

## 2012-11-20 VITALS — BP 130/82 | HR 59 | Temp 98.1°F | Resp 16 | Wt 209.5 lb

## 2012-11-20 DIAGNOSIS — Z23 Encounter for immunization: Secondary | ICD-10-CM

## 2012-11-20 DIAGNOSIS — I10 Essential (primary) hypertension: Secondary | ICD-10-CM

## 2012-11-20 DIAGNOSIS — E785 Hyperlipidemia, unspecified: Secondary | ICD-10-CM

## 2012-11-20 LAB — HEPATIC FUNCTION PANEL
AST: 22 U/L (ref 0–37)
Albumin: 4.1 g/dL (ref 3.5–5.2)
Alkaline Phosphatase: 54 U/L (ref 39–117)
Bilirubin, Direct: 0 mg/dL (ref 0.0–0.3)

## 2012-11-20 LAB — CBC WITH DIFFERENTIAL/PLATELET
Basophils Relative: 0.7 % (ref 0.0–3.0)
Eosinophils Relative: 3 % (ref 0.0–5.0)
MCV: 89.9 fl (ref 78.0–100.0)
Monocytes Absolute: 0.5 10*3/uL (ref 0.1–1.0)
Monocytes Relative: 7.3 % (ref 3.0–12.0)
Neutrophils Relative %: 54.6 % (ref 43.0–77.0)
RBC: 4.42 Mil/uL (ref 3.87–5.11)
WBC: 7.1 10*3/uL (ref 4.5–10.5)

## 2012-11-20 LAB — LIPID PANEL
Cholesterol: 256 mg/dL — ABNORMAL HIGH (ref 0–200)
HDL: 45.7 mg/dL (ref >39.00)
Total CHOL/HDL Ratio: 6
VLDL: 53.6 mg/dL — ABNORMAL HIGH (ref 0.0–40.0)

## 2012-11-20 LAB — BASIC METABOLIC PANEL
CO2: 29 mEq/L (ref 19–32)
GFR: 104.98 mL/min (ref >60.00)
Glucose, Bld: 110 mg/dL — ABNORMAL HIGH (ref 70–99)
Potassium: 4.3 mEq/L (ref 3.5–5.1)
Sodium: 140 mEq/L (ref 135–145)

## 2012-11-20 LAB — LDL CHOLESTEROL, DIRECT: Direct LDL: 177.2 mg/dL

## 2012-11-20 NOTE — Progress Notes (Signed)
  Subjective:    Patient ID: Sherry Ross, female    DOB: September 30, 1955, 57 y.o.   MRN: [id number]  HPI HTN- chronic problem, on HCTZ daily.  Denies CP, SOB, HAs, visual changes, edema.  Hyperlipidemia- chronic problem, was on Lipitor but pt stopped this months ago.  'i think it effects my muscles'.  'i just didn't feel comfortable taking it'.   Review of Systems For ROS see HPI     Objective:   Physical Exam  Vitals reviewed. Constitutional: She is oriented to person, place, and time. She appears well-developed and well-nourished. No distress.  HENT:  Head: Normocephalic and atraumatic.  Eyes: Conjunctivae and EOM are normal. Pupils are equal, round, and reactive to light.  Neck: Normal range of motion. Neck supple. No thyromegaly present.  Cardiovascular: Normal rate, regular rhythm, normal heart sounds and intact distal pulses.   No murmur heard. Pulmonary/Chest: Effort normal and breath sounds normal. No respiratory distress.  Abdominal: Soft. She exhibits no distension. There is no tenderness.  Musculoskeletal: She exhibits no edema.  Lymphadenopathy:    She has no cervical adenopathy.  Neurological: She is alert and oriented to person, place, and time.  Skin: Skin is warm and dry.  Psychiatric: She has a normal mood and affect. Her behavior is normal.          Assessment & Plan:

## 2012-11-20 NOTE — Patient Instructions (Addendum)
Schedule your complete physical at your convenience in the next 3 months We'll notify you of your lab results and make any changes if needed Call with any questions or concerns Keep up the good work!  You look great! Happy Fall!!!

## 2012-11-20 NOTE — Assessment & Plan Note (Signed)
Chronic problem.  Well controlled.  Asymptomatic.  Check labs.  No anticipated med changes. 

## 2012-11-20 NOTE — Assessment & Plan Note (Signed)
Chronic problem.  Stopped taking Lipitor.  Due for labs.  Restart meds PRN.

## 2012-11-23 ENCOUNTER — Ambulatory Visit: Payer: 59

## 2012-11-23 DIAGNOSIS — R7309 Other abnormal glucose: Secondary | ICD-10-CM

## 2012-11-23 LAB — HEMOGLOBIN A1C: Hgb A1c MFr Bld: 5.9 % (ref 4.6–6.5)

## 2012-11-30 ENCOUNTER — Telehealth: Payer: Self-pay | Admitting: Family Medicine

## 2012-11-30 DIAGNOSIS — E785 Hyperlipidemia, unspecified: Secondary | ICD-10-CM

## 2012-11-30 MED ORDER — ATORVASTATIN CALCIUM 10 MG PO TABS: 10.0000 mg | ORAL_TABLET | Freq: Every day | ORAL | Status: DC

## 2012-11-30 NOTE — Telephone Encounter (Signed)
Patient called about a refill for atorvastatin (LIPITOR) 10 MG tablet thanks [facility] [employer] [address]

## 2012-11-30 NOTE — Telephone Encounter (Signed)
Med filled.  

## 2012-12-20 ENCOUNTER — Encounter: Payer: Self-pay | Admitting: Family Medicine

## 2012-12-26 ENCOUNTER — Other Ambulatory Visit: Payer: Self-pay | Admitting: Dermatology

## 2013-01-13 ENCOUNTER — Other Ambulatory Visit: Payer: Self-pay | Admitting: Family Medicine

## 2013-01-14 NOTE — Telephone Encounter (Signed)
Med filled.  

## 2013-01-24 ENCOUNTER — Encounter: Payer: Self-pay | Admitting: Family Medicine

## 2013-01-24 ENCOUNTER — Ambulatory Visit (INDEPENDENT_AMBULATORY_CARE_PROVIDER_SITE_OTHER): Payer: 59 | Admitting: Family Medicine

## 2013-01-24 VITALS — BP 130/80 | HR 66 | Temp 98.2°F | Ht 64.5 in | Wt 210.4 lb

## 2013-01-24 DIAGNOSIS — Z0000 Encounter for general adult medical examination without abnormal findings: Secondary | ICD-10-CM

## 2013-01-24 DIAGNOSIS — E669 Obesity, unspecified: Secondary | ICD-10-CM | POA: Insufficient documentation

## 2013-01-24 DIAGNOSIS — N3281 Overactive bladder: Secondary | ICD-10-CM | POA: Insufficient documentation

## 2013-01-24 DIAGNOSIS — Z23 Encounter for immunization: Secondary | ICD-10-CM

## 2013-01-24 MED ORDER — SOLIFENACIN SUCCINATE 5 MG PO TABS: 5.0000 mg | ORAL_TABLET | Freq: Every day | ORAL | Status: DC

## 2013-01-24 NOTE — Progress Notes (Signed)
   Subjective:    Patient ID: Sherry Ross, female    DOB: Feb 09, 1956, 57 y.o.   MRN: [id number]  HPI CPE- UTD on pap, mammo, colonoscopy.    OAB- pt reports bladder control has worsened.  ~1x/month 'i'll have to change my clothes'.  Is attempting to void on schedule.   Review of Systems Patient reports no vision/ hearing changes, adenopathy, fever, weight change,  persistant/recurrent hoarseness , swallowing issues, chest pain, palpitations, edema, persistant/recurrent cough, hemoptysis, dyspnea (rest/exertional/paroxysmal nocturnal), gastrointestinal bleeding (melena, rectal bleeding), abdominal pain, significant heartburn, bowel changes, Gyn symptoms (abnormal  bleeding, pain),  syncope, focal weakness, memory loss, numbness & tingling, skin/hair/nail changes, abnormal bruising or bleeding, anxiety, or depression.     Objective:   Physical Exam General Appearance:    Alert, cooperative, no distress, appears stated age  Head:    Normocephalic, without obvious abnormality, atraumatic  Eyes:    PERRL, conjunctiva/corneas clear, EOM's intact, fundi    benign, both eyes  Ears:    Normal TM's and external ear canals, both ears  Nose:   Nares normal, septum midline, mucosa normal, no drainage    or sinus tenderness  Throat:   Lips, mucosa, and tongue normal; teeth and gums normal  Neck:   Supple, symmetrical, trachea midline, no adenopathy;    Thyroid: no enlargement/tenderness/nodules  Back:     Symmetric, no curvature, ROM normal, no CVA tenderness  Lungs:     Clear to auscultation bilaterally, respirations unlabored  Chest Wall:    No tenderness or deformity   Heart:    Regular rate and rhythm, S1 and S2 normal, no murmur, rub   or gallop  Breast Exam:    Deferred to mammo  Abdomen:     Soft, non-tender, bowel sounds active all four quadrants,    no masses, no organomegaly  Genitalia:    Deferred  Rectal:    Extremities:   Extremities normal, atraumatic, no cyanosis or edema    Pulses:   2+ and symmetric all extremities  Skin:   Skin color, texture, turgor normal, no rashes or lesions  Lymph nodes:   Cervical, supraclavicular, and axillary nodes normal  Neurologic:   CNII-XII intact, normal strength, sensation and reflexes    throughout          Assessment & Plan:

## 2013-01-24 NOTE — Assessment & Plan Note (Signed)
Pt's PE WNL.  UTD on health maintenance.  Check labs.  Anticipatory guidance provided.  

## 2013-01-24 NOTE — Progress Notes (Signed)
Pre visit review using our clinic review tool, if applicable. No additional management support is needed unless otherwise documented below in the visit note. 

## 2013-01-24 NOTE — Assessment & Plan Note (Signed)
New.  Start low dose Vesicare.  If no improvement, refer to Urology.  Pt expressed understanding and is in agreement w/ plan.

## 2013-01-24 NOTE — Assessment & Plan Note (Signed)
Encouraged regular, aerobic activity for 30 minutes at least 4x/week and monitoring caloric intake w/ the help of MyFitnessPal app.  

## 2013-01-24 NOTE — Patient Instructions (Signed)
Follow up in 6 months to recheck cholesterol Try and make healthy food choices and get regular exercise Start the Vesicare daily for overactive bladder- if no improvement in 1-2 months, call me and we'll send you to urology Take 1 Immodium daily to slow your bowels Call with any questions or concerns Happy Holidays!!!

## 2013-02-04 ENCOUNTER — Telehealth: Payer: Self-pay | Admitting: Family Medicine

## 2013-02-04 NOTE — Telephone Encounter (Signed)
Patient is calling in regards to her medication that she takes for her mood. She is unsure what the name of it is but says that he pharmacy has been trying to contact us for authorization but has not received a response. Please advise.

## 2013-02-08 ENCOUNTER — Telehealth: Payer: Self-pay | Admitting: *Deleted

## 2013-02-08 NOTE — Telephone Encounter (Signed)
[employer]. Prior authorization is still under review. Will call and check on Monday again

## 2013-02-08 NOTE — Telephone Encounter (Signed)
[facility] called to check the status of her refill for solifenacin (VESICARE) 5 MG tablet. Patient would like a phone called when approved because she is in [place] and will find a pharmacy there we can send it to.

## 2013-02-08 NOTE — Telephone Encounter (Signed)
Have not received a prior authorization for this patient for Lexapro. The only prior authorization I got for this patient is for Vesicare. Called pharmacy to refax prior authorization for this medication.SW

## 2013-02-12 ENCOUNTER — Other Ambulatory Visit: Payer: Self-pay | Admitting: General Practice

## 2013-02-12 ENCOUNTER — Telehealth: Payer: Self-pay | Admitting: General Practice

## 2013-02-12 MED ORDER — FESOTERODINE FUMARATE ER 4 MG PO TB24: 4.0000 mg | ORAL_TABLET | Freq: Every day | ORAL | Status: DC

## 2013-02-12 NOTE — Telephone Encounter (Signed)
Received a fax from [employer] regarding pt's Vesicare. Was noted by tabori that insurance will no longer cover Vesicare instead it will be switched to [place] Pt notified, rx filled and chart updated.

## 2013-02-14 LAB — HM DEXA SCAN: HM Dexa Scan: NORMAL

## 2013-02-20 ENCOUNTER — Telehealth: Payer: Self-pay | Admitting: Family Medicine

## 2013-02-20 MED ORDER — OXYBUTYNIN CHLORIDE ER 5 MG PO TB24: 5.0000 mg | ORAL_TABLET | Freq: Every day | ORAL | Status: DC

## 2013-02-20 NOTE — Telephone Encounter (Signed)
Med filled and pt notified.  

## 2013-02-20 NOTE — Telephone Encounter (Signed)
Ditropan XL 5mg  daily, #30, 6 refills

## 2013-02-20 NOTE — Telephone Encounter (Signed)
Patient called back with the mediation information. Her insurance will cover Ditropan Xl (Oxyvutynin)

## 2013-02-20 NOTE — Telephone Encounter (Signed)
Patient states the medication that was sent in for her overactive bladder was $80 with her insurance. She does not want to pay that much for it and is requesting that something else be called into her pharmacy. Please advise.

## 2013-02-20 NOTE — Telephone Encounter (Signed)
Pt was advised to call insurance to see what is preferred on her formulary list. Waiting for pt to return call.

## 2013-02-21 ENCOUNTER — Other Ambulatory Visit: Payer: Self-pay | Admitting: Family Medicine

## 2013-02-21 NOTE — Telephone Encounter (Signed)
Med filled.  

## 2013-04-26 ENCOUNTER — Other Ambulatory Visit: Payer: Self-pay | Admitting: Family Medicine

## 2013-04-26 NOTE — Telephone Encounter (Signed)
Med filled.  

## 2013-05-27 ENCOUNTER — Other Ambulatory Visit: Payer: Self-pay | Admitting: Family Medicine

## 2013-05-27 NOTE — Telephone Encounter (Signed)
Med filled.  

## 2013-06-04 ENCOUNTER — Encounter: Payer: Self-pay | Admitting: Family Medicine

## 2013-06-04 ENCOUNTER — Telehealth: Payer: Self-pay

## 2013-06-04 NOTE — Telephone Encounter (Signed)
Noted- agree w/ advice given.  I don't see any documentation of paperwork being received by office.  If I did sign off on it, it was sent to be scanned (not in chart yet).

## 2013-06-04 NOTE — Telephone Encounter (Signed)
3.6.15 Pt attended a [employer] Screening at a [employer].  Results came in and indicated that patient needed to follow-up with PCP given her elevated BP and cholesterol levels.  According to the patient, once she received her results she dropped them off at the office and left them at the front desk to be given to her PCP's nurse (this was done between March 30th and April 4th).  She shared that she had not heard anything back yet and became concerned.  She was hoping to hear back from the office as to whether she needed to make an appointment or not.  She took her blood pressure yesterday at [facility] and her blood pressure was fine ("120 something over 70 something").  She states that she feels fine and has not been having any issues.  Her last office visit was 01/24/13.  She last had labs drawn on 11/20/12.  She is currently on Atorvastatin 10 mg daily and HCTZ 12. 5 mg daily and has been taking her medication as prescribed.  Patient was told that at this time she does not need an appointment, but reminded her to keep her 6 month follow up appt to recheck cholesterol scheduled for 07/25/13.  She was also encouraged to monitor her blood pressure routinely at home and that she notices changes from her baseline to call office for further instructions.  Patient stated understanding and agreed with plan. No further questions or concerns were voiced.

## 2013-06-20 ENCOUNTER — Telehealth: Payer: Self-pay | Admitting: *Deleted

## 2013-06-20 DIAGNOSIS — N3281 Overactive bladder: Secondary | ICD-10-CM

## 2013-06-20 NOTE — Telephone Encounter (Signed)
Ok for referral?

## 2013-06-20 NOTE — Telephone Encounter (Signed)
Caller name:  Shamiracle Relation to pt:  self Call back number:  [phone] Pharmacy:  [facility]  Reason for call:   Pt called and states the medication Birdie Riddle gave her for over-active bladder is not working.  She states they had discussed referral to urologist, and request that the referral be placed.  Please advise.  bw

## 2013-06-20 NOTE — Telephone Encounter (Signed)
Per December 2014 OV note if not any better call in 1-2 months and we will schedule a referral. Still ok for this referral?

## 2013-06-20 NOTE — Telephone Encounter (Signed)
Called pt and [facility], notifying.

## 2013-06-20 NOTE — Telephone Encounter (Signed)
Referral placed and pt notified

## 2013-07-11 ENCOUNTER — Encounter: Payer: Self-pay | Admitting: Family Medicine

## 2013-07-18 ENCOUNTER — Encounter: Payer: Self-pay | Admitting: Family Medicine

## 2013-07-18 ENCOUNTER — Ambulatory Visit (INDEPENDENT_AMBULATORY_CARE_PROVIDER_SITE_OTHER): Payer: 59 | Admitting: Family Medicine

## 2013-07-18 VITALS — BP 120/80 | HR 88 | Temp 97.9°F | Resp 16 | Wt 208.2 lb

## 2013-07-18 DIAGNOSIS — E785 Hyperlipidemia, unspecified: Secondary | ICD-10-CM

## 2013-07-18 DIAGNOSIS — E669 Obesity, unspecified: Secondary | ICD-10-CM

## 2013-07-18 DIAGNOSIS — I10 Essential (primary) hypertension: Secondary | ICD-10-CM

## 2013-07-18 LAB — HEPATIC FUNCTION PANEL
ALT: 32 U/L (ref 0–35)
AST: 36 U/L (ref 0–37)
Albumin: 4.2 g/dL (ref 3.5–5.2)
Alkaline Phosphatase: 66 U/L (ref 39–117)
BILIRUBIN DIRECT: 0.1 mg/dL (ref 0.0–0.3)
TOTAL PROTEIN: 7.3 g/dL (ref 6.0–8.3)
Total Bilirubin: 0.7 mg/dL (ref 0.2–1.2)

## 2013-07-18 LAB — BASIC METABOLIC PANEL
BUN: 13 mg/dL (ref 6–23)
CHLORIDE: 102 meq/L (ref 96–112)
CO2: 29 mEq/L (ref 19–32)
Calcium: 9.4 mg/dL (ref 8.4–10.5)
Creatinine, Ser: 0.5 mg/dL (ref 0.4–1.2)
GFR: 125.53 mL/min (ref >60.00)
Glucose, Bld: 99 mg/dL (ref 70–99)
POTASSIUM: 3.8 meq/L (ref 3.5–5.1)
SODIUM: 138 meq/L (ref 135–145)

## 2013-07-18 LAB — LIPID PANEL
Cholesterol: 207 mg/dL — ABNORMAL HIGH (ref 0–200)
HDL: 41.5 mg/dL (ref >39.00)
LDL Cholesterol: 119 mg/dL — ABNORMAL HIGH (ref 0–99)
NONHDL: 165.5
Total CHOL/HDL Ratio: 5
Triglycerides: 234 mg/dL — ABNORMAL HIGH (ref 0.0–149.0)
VLDL: 46.8 mg/dL — ABNORMAL HIGH (ref 0.0–40.0)

## 2013-07-18 MED ORDER — LORCASERIN HCL 10 MG PO TABS: 1.0000 | ORAL_TABLET | Freq: Two times a day (BID) | ORAL | Status: DC

## 2013-07-18 MED ORDER — PITAVASTATIN CALCIUM 4 MG PO TABS: 1.0000 | ORAL_TABLET | Freq: Every day | ORAL | Status: DC

## 2013-07-18 NOTE — Progress Notes (Signed)
   Subjective:    Patient ID: Sherry Ross, female    DOB: 1955/12/25, 58 y.o.   MRN: [id number]  HPI Hyperlipidemia- noted on labs in October and was started on Lipitor.  Stopped meds ~1 week ago and severe muscle aches that she's been struggling w/ have nearly completely resolved.    HTN- chronic problem, on HCTZ daily.  Denies CP, SOB, HAs, visual changes, edema.  Obesity- chronic problem for pt, interested in starting Belviq.   Review of Systems For ROS see HPI     Objective:   Physical Exam  Vitals reviewed. Constitutional: She is oriented to person, place, and time. She appears well-developed and well-nourished. No distress.  HENT:  Head: Normocephalic and atraumatic.  Eyes: Conjunctivae and EOM are normal. Pupils are equal, round, and reactive to light.  Neck: Normal range of motion. Neck supple. No thyromegaly present.  Cardiovascular: Normal rate, regular rhythm, normal heart sounds and intact distal pulses.   No murmur heard. Pulmonary/Chest: Effort normal and breath sounds normal. No respiratory distress.  Abdominal: Soft. She exhibits no distension. There is no tenderness.  Musculoskeletal: She exhibits no edema.  Lymphadenopathy:    She has no cervical adenopathy.  Neurological: She is alert and oriented to person, place, and time.  Skin: Skin is warm and dry.  Psychiatric: She has a normal mood and affect. Her behavior is normal.          Assessment & Plan:

## 2013-07-18 NOTE — Patient Instructions (Signed)
Follow up in 3 months to recheck weight los progress START the Livalo daily for cholesterol After being on the Livalo for 2-4 weeks, you can start the Belviq twice daily Try and make healthy food choices and get regular exercise We'll notify you of your lab results and make any changes if needed Call with any questions or concerns Have a great summer!

## 2013-07-18 NOTE — Assessment & Plan Note (Signed)
Ongoing problem for pt.  She did [employer], [employer] and would like to start [employer].  Reviewed appropriate way to take medication, need for healthy diet and regular exercise.  Reviewed possible side effects- including serotonin syndrome.  Pt aware and knows that if she has any side effects she is to stop medication and call office.

## 2013-07-18 NOTE — Progress Notes (Signed)
Pre visit review using our clinic review tool, if applicable. No additional management support is needed unless otherwise documented below in the visit note. 

## 2013-07-18 NOTE — Assessment & Plan Note (Signed)
Chronic problem, well controlled on HCTZ.  Check labs.  No anticipated med changes.

## 2013-07-18 NOTE — Assessment & Plan Note (Signed)
Chronic problem.  Intolerant to Lipitor due to severe myalgias- these completely resolved after stopping meds.  Check labs.  Start Livalo in hopes this is better tolerated by pt.  Coupon provided.  Will follow.

## 2013-07-19 ENCOUNTER — Telehealth: Payer: Self-pay | Admitting: Family Medicine

## 2013-07-19 ENCOUNTER — Other Ambulatory Visit: Payer: Self-pay | Admitting: General Practice

## 2013-07-19 MED ORDER — EZETIMIBE 10 MG PO TABS
10.0000 mg | ORAL_TABLET | Freq: Every day | ORAL | Status: DC
Start: 2013-07-19 — End: 2013-08-19

## 2013-07-19 NOTE — Telephone Encounter (Signed)
Relevant patient education assigned to patient using Emmi. ° °

## 2013-07-25 ENCOUNTER — Ambulatory Visit: Payer: 59 | Admitting: Family Medicine

## 2013-08-14 ENCOUNTER — Encounter: Payer: Self-pay | Admitting: Family Medicine

## 2013-08-19 MED ORDER — ROSUVASTATIN CALCIUM 5 MG PO TABS: 5.0000 mg | ORAL_TABLET | Freq: Every day | ORAL | Status: DC

## 2013-09-10 ENCOUNTER — Other Ambulatory Visit: Payer: Self-pay | Admitting: Family Medicine

## 2013-09-10 NOTE — Telephone Encounter (Signed)
Med filled.  

## 2013-09-12 ENCOUNTER — Encounter: Payer: Self-pay | Admitting: Family Medicine

## 2013-09-12 MED ORDER — NALTREXONE-BUPROPION HCL ER 8-90 MG PO TB12: ORAL_TABLET | ORAL | Status: DC

## 2013-09-16 ENCOUNTER — Ambulatory Visit (INDEPENDENT_AMBULATORY_CARE_PROVIDER_SITE_OTHER): Payer: 59 | Admitting: Medical

## 2013-09-16 ENCOUNTER — Encounter: Payer: Self-pay | Admitting: Medical

## 2013-09-16 VITALS — BP 136/81 | HR 69 | Temp 97.9°F | Resp 16 | Wt 205.4 lb

## 2013-09-16 DIAGNOSIS — L02818 Cutaneous abscess of other sites: Secondary | ICD-10-CM

## 2013-09-16 DIAGNOSIS — L0390 Cellulitis, unspecified: Secondary | ICD-10-CM | POA: Insufficient documentation

## 2013-09-16 DIAGNOSIS — L03811 Cellulitis of head [any part, except face]: Secondary | ICD-10-CM

## 2013-09-16 DIAGNOSIS — L03818 Cellulitis of other sites: Secondary | ICD-10-CM

## 2013-09-16 LAB — CBC WITH DIFFERENTIAL/PLATELET
Basophils Absolute: 0 10*3/uL (ref 0.0–0.1)
Basophils Relative: 0.5 % (ref 0.0–3.0)
Eosinophils Absolute: 0.3 10*3/uL (ref 0.0–0.7)
Eosinophils Relative: 4.2 % (ref 0.0–5.0)
HCT: 38.5 % (ref 36.0–46.0)
Hemoglobin: 13.1 g/dL (ref 12.0–15.0)
Lymphocytes Relative: 34.1 % (ref 12.0–46.0)
Lymphs Abs: 2.5 10*3/uL (ref 0.7–4.0)
MCHC: 34 g/dL (ref 30.0–36.0)
MCV: 89.1 fl (ref 78.0–100.0)
MONOS PCT: 7.1 % (ref 3.0–12.0)
Monocytes Absolute: 0.5 10*3/uL (ref 0.1–1.0)
NEUTROS ABS: 4 10*3/uL (ref 1.4–7.7)
Neutrophils Relative %: 54.1 % (ref 43.0–77.0)
PLATELETS: 257 10*3/uL (ref 150.0–400.0)
RBC: 4.32 Mil/uL (ref 3.87–5.11)
RDW: 13.4 % (ref 11.5–15.5)
WBC: 7.4 10*3/uL (ref 4.0–10.5)

## 2013-09-16 MED ORDER — DOXYCYCLINE HYCLATE 100 MG PO TABS: 100.0000 mg | ORAL_TABLET | Freq: Two times a day (BID) | ORAL | Status: DC

## 2013-09-16 NOTE — Assessment & Plan Note (Addendum)
Rx doxycycline and got cbc today. Will recheck area in 7 days after swelling decreases or sooner if worsens. At that point may refer to specialist/dermatologist as area may be infected sebacious cyst, lymph node or lipoma. Cbc is ordered today as well.

## 2013-09-16 NOTE — Patient Instructions (Signed)
For your scalp area that appears infected I want you to take antibiotic. Please get lab today in our office as well. Follow up in one week for reevaluation as differential diagnosis for area is  enflamed lymph node in area secondary to infection vs  infected sebaceous cyst. I want to evaluate area after inflammation. Then may refer to dermatologist for procedure.

## 2013-09-16 NOTE — Progress Notes (Signed)
Subjective:    Patient ID: Sherry Ross, female    DOB: 09-24-1955, 58 y.o.   MRN: [id number]  HPI   Pt in with 6 weeks  on and off swelling of her rt occipital area. Area was moderate swollen but decreased in size. But now recent reoccurence over past 5-7 days. Pt thinks area went down briefly when she was on antibiotic for tooth procedure. But last week reoccuring   No fevers or chills.   Past Medical History  Diagnosis Date  . Hyperlipidemia   . Hypertension     History   Social History  . Marital Status: Married    Spouse Name: N/A    Number of Children: N/A  . Years of Education: N/A   Occupational History  . medical billing    Social History Main Topics  . Smoking status: Former Smoker -- 0.80 packs/day for 4 years    Types: Cigarettes    Quit date: 02/15/1983  . Smokeless tobacco: Never Used  . Alcohol Use: Yes  . Drug Use: No  . Sexual Activity: Not on file   Other Topics Concern  . Not on file   Social History Narrative  . No narrative on file    Past Surgical History  Procedure Laterality Date  . Cholecystectomy  1999  . Abdominal hysterectomy  1995    Family History  Problem Relation Age of Onset  . Alcohol abuse Father   . Uterine cancer Mother   . Breast cancer Mother   . Heart disease Brother   . Hypertension Brother   . Diabetes Brother     No Known Allergies  Current Outpatient Prescriptions on File Prior to Visit  Medication Sig Dispense Refill  . conjugated estrogens (PREMARIN) vaginal cream Place vaginally daily. 1/2-1 gram daily x2 weeks and then decrease to 2-3x/week  42.5 g  12  . escitalopram (LEXAPRO) 10 MG tablet TAKE 1 TABLET BY MOUTH DAILY  30 tablet  3  . hydrochlorothiazide (MICROZIDE) 12.5 MG capsule TAKE ONE CAPSULE EVERY DAY  30 capsule  6  . Naltrexone-Bupropion HCl ER (CONTRAVE) 8-90 MG TB12 1 tab daily x1 week, then 1 tab BID x1 week, then 2 tabs Qam and 1 tab QHS x1 week, finally 2 tabs BID  120 tablet  3  .  oxybutynin (DITROPAN-XL) 5 MG 24 hr tablet Take 1 tablet (5 mg total) by mouth at bedtime.  30 tablet  6  . rosuvastatin (CRESTOR) 5 MG tablet Take 1 tablet (5 mg total) by mouth daily.  30 tablet  3   No current facility-administered medications on file prior to visit.    BP 136/81  Pulse 69  Temp(Src) 97.9 F (36.6 C) (Oral)  Resp 16  Wt 205 lb 6 oz (93.157 kg)  SpO2 94%  LMP 02/14/1993     Review of Systems  Constitutional: Negative for fever, chills and fatigue.  HENT: Negative.   Respiratory: Negative for cough, chest tightness and wheezing.   Cardiovascular: Negative for chest pain and palpitations.  Gastrointestinal: Negative.   Musculoskeletal: Positive for neck pain. Negative for neck stiffness.       Pain in rt occipital area when moves neck over swollen scalp area.  Hematological: Does not bruise/bleed easily.       Maybe swollen lymph node in area where scalp is swollen.       Objective:   Physical Exam  General No acute, distress, pleasant pt. Neck - from, no nucall rigidity, no  lymphadenopathy. Skin- posterior scalp, 3.0 cm wide area of inflamed scalp. Mild tenderness, no fluctuance, no induration. Scalp color faint pink. No warmth. I think I feel 1 cm posslble circular shaped lump beneath surface.  Lungs- clear even and unlabored. Heart- RRR.       Assessment & Plan:

## 2013-09-16 NOTE — Progress Notes (Signed)
Pre visit review using our clinic review tool, if applicable. No additional management support is needed unless otherwise documented below in the visit note. 

## 2013-09-25 ENCOUNTER — Ambulatory Visit: Payer: 59 | Admitting: Family Medicine

## 2013-09-25 ENCOUNTER — Ambulatory Visit (INDEPENDENT_AMBULATORY_CARE_PROVIDER_SITE_OTHER): Payer: 59 | Admitting: Medical

## 2013-09-25 ENCOUNTER — Encounter: Payer: Self-pay | Admitting: Medical

## 2013-09-25 VITALS — BP 118/72 | HR 82 | Temp 97.8°F | Wt 206.0 lb

## 2013-09-25 DIAGNOSIS — L02818 Cutaneous abscess of other sites: Secondary | ICD-10-CM

## 2013-09-25 DIAGNOSIS — L03811 Cellulitis of head [any part, except face]: Secondary | ICD-10-CM

## 2013-09-25 DIAGNOSIS — L723 Sebaceous cyst: Secondary | ICD-10-CM | POA: Insufficient documentation

## 2013-09-25 DIAGNOSIS — L03818 Cellulitis of other sites: Secondary | ICD-10-CM

## 2013-09-25 NOTE — Progress Notes (Signed)
Pre visit review using our clinic review tool, if applicable. No additional management support is needed unless otherwise documented below in the visit note. 

## 2013-09-25 NOTE — Assessment & Plan Note (Addendum)
I think area looks improved in terms of prior red area improved now. No further need of antibiotics.

## 2013-09-25 NOTE — Assessment & Plan Note (Signed)
I am thinking she may have had an infected sebaceous cyst that is partially resolved. I can still feel 1 cm faint circular mass. Will refer pt to dermatology.

## 2013-09-25 NOTE — Patient Instructions (Signed)
I think your skin looks better. You may have had infection along with probable sebaceous cyst. Antibiotics no longer needed but will refer you to dermatology. I am prescribing diflucan for your described yeast infection. If prior to dermatology you have worsening or changing signs or symptoms let us know.

## 2013-09-25 NOTE — Progress Notes (Signed)
Subjective:    Patient ID: Sherry Ross, female    DOB: 22-May-1955, 58 y.o.   MRN: [id number]  HPI    Subjective:    Patient ID: Sherry Ross, female    DOB: [birth year], 58 y.o.   MRN: [id number]  HPI   Pt in with 6 weeks  on and off swelling of her rt occipital area. Area was moderate swollen but decreased in size. But now recent reoccurence over past 5-7 days. Pt thinks area went down briefly when she was on antibiotic for tooth procedure. But last week reoccuring   Above is from last note prior visit before I placed on doxycycline. She used for 10 days and noticed minimal improvement. About the same.  No fevers or chills.   Past Medical History  Diagnosis Date  . Hyperlipidemia   . Hypertension     History   Social History  . Marital Status: Married    Spouse Name: N/A    Number of Children: N/A  . Years of Education: N/A   Occupational History  . medical billing    Social History Main Topics  . Smoking status: Former Smoker -- 0.80 packs/day for 4 years    Types: Cigarettes    Quit date: 02/15/1983  . Smokeless tobacco: Never Used  . Alcohol Use: Yes  . Drug Use: No  . Sexual Activity: Not on file   Other Topics Concern  . Not on file   Social History Narrative  . No narrative on file    Past Surgical History  Procedure Laterality Date  . Cholecystectomy  1999  . Abdominal hysterectomy  1995    Family History  Problem Relation Age of Onset  . Alcohol abuse Father   . Uterine cancer Mother   . Breast cancer Mother   . Heart disease Brother   . Hypertension Brother   . Diabetes Brother     No Known Allergies  Current Outpatient Prescriptions on File Prior to Visit  Medication Sig Dispense Refill  . conjugated estrogens (PREMARIN) vaginal cream Place vaginally Sherry. 1/2-1 gram Sherry x2 weeks and then decrease to 2-3x/week  42.5 g  12  . doxycycline (VIBRA-TABS) 100 MG tablet Take 1 tablet (100 mg total) by mouth 2 (two) times Sherry.  20  tablet  0  . escitalopram (LEXAPRO) 10 MG tablet TAKE 1 TABLET BY MOUTH Sherry  30 tablet  3  . hydrochlorothiazide (MICROZIDE) 12.5 MG capsule TAKE ONE CAPSULE EVERY DAY  30 capsule  6  . Naltrexone-Bupropion HCl ER (CONTRAVE) 8-90 MG TB12 1 tab Sherry x1 week, then 1 tab BID x1 week, then 2 tabs Qam and 1 tab QHS x1 week, finally 2 tabs BID  120 tablet  3  . oxybutynin (DITROPAN-XL) 5 MG 24 hr tablet Take 1 tablet (5 mg total) by mouth at bedtime.  30 tablet  6  . rosuvastatin (CRESTOR) 5 MG tablet Take 1 tablet (5 mg total) by mouth Sherry.  30 tablet  3   No current facility-administered medications on file prior to visit.    BP 118/72  Pulse 82  Temp(Src) 97.8 F (36.6 C)  Wt 206 lb (93.441 kg)  SpO2 100%  LMP 02/14/1993     Review of Systems  Constitutional: Negative for fever, chills and fatigue.  HENT: Negative.   Respiratory: Negative for cough, chest tightness and wheezing.   Cardiovascular: Negative for chest pain and palpitations.  Gastrointestinal: Negative.   Musculoskeletal:  Negative for neck stiffness.  Hematological: Does not bruise/bleed easily.  Skin- still mild swollen rt occiptal area.         Objective:   Physical Exam  General No acute, distress, pleasant pt. Neck - from, no nucall rigidity, no lymphadenopathy. Skin- posterior scalp, Mild tenderness, no fluctuance, no induration. Scalp color faint pink. No warmth. I think I feel 1 cm posslble circular shaped lump beneath surface.  Lungs- clear even and unlabored. Heart- RRR.       Assessment & Plan:      Review of Systems       Objective:   Physical Exam        Assessment & Plan:

## 2013-09-27 ENCOUNTER — Telehealth: Payer: Self-pay

## 2013-09-27 DIAGNOSIS — B373 Candidiasis of vulva and vagina: Secondary | ICD-10-CM

## 2013-09-27 DIAGNOSIS — B3731 Acute candidiasis of vulva and vagina: Secondary | ICD-10-CM

## 2013-09-27 MED ORDER — FLUCONAZOLE 150 MG PO TABS: 150.0000 mg | ORAL_TABLET | Freq: Once | ORAL | Status: DC

## 2013-09-27 NOTE — Telephone Encounter (Signed)
Sent Rx to [facility] Patient notified

## 2013-09-27 NOTE — Telephone Encounter (Signed)
Caller name:Jamyiah Relation to pt: Call back [phone] [facility] [address]  Reason for call: Millie called to say we forgot to call her in something for yeast infection

## 2013-10-23 ENCOUNTER — Ambulatory Visit: Payer: 59 | Admitting: Family Medicine

## 2013-11-26 ENCOUNTER — Other Ambulatory Visit: Payer: Self-pay | Admitting: General Practice

## 2013-11-26 ENCOUNTER — Encounter: Payer: Self-pay | Admitting: General Practice

## 2013-11-26 MED ORDER — ROSUVASTATIN CALCIUM 5 MG PO TABS
5.0000 mg | ORAL_TABLET | Freq: Every day | ORAL | Status: DC
Start: 2013-11-26 — End: 2013-12-02

## 2013-12-02 ENCOUNTER — Other Ambulatory Visit: Payer: Self-pay | Admitting: General Practice

## 2013-12-02 MED ORDER — ROSUVASTATIN CALCIUM 5 MG PO TABS: 5.0000 mg | ORAL_TABLET | Freq: Every day | ORAL | Status: DC

## 2013-12-20 LAB — HM MAMMOGRAPHY

## 2013-12-24 ENCOUNTER — Encounter: Payer: Self-pay | Admitting: General Practice

## 2013-12-30 ENCOUNTER — Encounter: Payer: Self-pay | Admitting: Family Medicine

## 2014-01-01 ENCOUNTER — Other Ambulatory Visit: Payer: Self-pay | Admitting: General Practice

## 2014-01-01 MED ORDER — HYDROCHLOROTHIAZIDE 12.5 MG PO CAPS: 12.5000 mg | ORAL_CAPSULE | Freq: Every day | ORAL | Status: DC

## 2014-01-15 ENCOUNTER — Encounter: Payer: Self-pay | Admitting: Family Medicine

## 2014-01-15 ENCOUNTER — Ambulatory Visit (INDEPENDENT_AMBULATORY_CARE_PROVIDER_SITE_OTHER): Payer: 59 | Admitting: Family Medicine

## 2014-01-15 VITALS — BP 122/80 | HR 61 | Temp 98.2°F | Resp 16 | Wt 210.2 lb

## 2014-01-15 DIAGNOSIS — N3281 Overactive bladder: Secondary | ICD-10-CM

## 2014-01-15 DIAGNOSIS — I10 Essential (primary) hypertension: Secondary | ICD-10-CM

## 2014-01-15 DIAGNOSIS — E785 Hyperlipidemia, unspecified: Secondary | ICD-10-CM

## 2014-01-15 MED ORDER — OXYBUTYNIN CHLORIDE ER 5 MG PO TB24: 10.0000 mg | ORAL_TABLET | Freq: Every day | ORAL | Status: DC

## 2014-01-15 NOTE — Progress Notes (Signed)
   Subjective:    Patient ID: Sherry Ross, female    DOB: 1955-02-22, 58 y.o.   MRN: [id number]  HPI HTN- chronic problem, on HCTZ.  Well controlled.  Denies CP, SOB, HAs, visual changes, edema  Hyperlipidemia- chronic problem, on Crestor.  Denies abd pain, N/V, myalgias.  OAB- pt had w/u w/ urology and $1000s later realized she needed oxybutynin BID rather than daily.  Asking if I can prescribe med.   Review of Systems For ROS see HPI     Objective:   Physical Exam  Constitutional: She is oriented to person, place, and time. She appears well-developed and well-nourished. No distress.  HENT:  Head: Normocephalic and atraumatic.  Eyes: Conjunctivae and EOM are normal. Pupils are equal, round, and reactive to light.  Neck: Normal range of motion. Neck supple. No thyromegaly present.  Cardiovascular: Normal rate, regular rhythm, normal heart sounds and intact distal pulses.   No murmur heard. Pulmonary/Chest: Effort normal and breath sounds normal. No respiratory distress.  Abdominal: Soft. She exhibits no distension. There is no tenderness.  Musculoskeletal: She exhibits no edema.  Lymphadenopathy:    She has no cervical adenopathy.  Neurological: She is alert and oriented to person, place, and time.  Skin: Skin is warm and dry.  Psychiatric: She has a normal mood and affect. Her behavior is normal.  Vitals reviewed.         Assessment & Plan:

## 2014-01-15 NOTE — Patient Instructions (Addendum)
Schedule your complete physical in 6 months We'll notify you of your lab results and make any changes if needed Keep up the good work on healthy diet and regular exercise Call with any questions or concerns Happy Holidays!! 

## 2014-01-15 NOTE — Progress Notes (Signed)
Pre visit review using our clinic review tool, if applicable. No additional management support is needed unless otherwise documented below in the visit note. 

## 2014-01-16 LAB — HEPATIC FUNCTION PANEL
ALK PHOS: 61 U/L (ref 39–117)
ALT: 25 U/L (ref 0–35)
AST: 28 U/L (ref 0–37)
Albumin: 4.7 g/dL (ref 3.5–5.2)
BILIRUBIN DIRECT: 0.1 mg/dL (ref 0.0–0.3)
TOTAL PROTEIN: 7.9 g/dL (ref 6.0–8.3)
Total Bilirubin: 0.7 mg/dL (ref 0.2–1.2)

## 2014-01-16 LAB — LIPID PANEL
CHOL/HDL RATIO: 5
CHOLESTEROL: 189 mg/dL (ref 0–200)
HDL: 40 mg/dL (ref >39.00)
NonHDL: 149
Triglycerides: 224 mg/dL — ABNORMAL HIGH (ref 0.0–149.0)
VLDL: 44.8 mg/dL — ABNORMAL HIGH (ref 0.0–40.0)

## 2014-01-16 LAB — BASIC METABOLIC PANEL
BUN: 20 mg/dL (ref 6–23)
CALCIUM: 9.2 mg/dL (ref 8.4–10.5)
CO2: 23 mEq/L (ref 19–32)
Chloride: 109 mEq/L (ref 96–112)
Creatinine, Ser: 0.6 mg/dL (ref 0.4–1.2)
GFR: 108.6 mL/min (ref >60.00)
Glucose, Bld: 88 mg/dL (ref 70–99)
Potassium: 3.7 mEq/L (ref 3.5–5.1)
Sodium: 146 mEq/L — ABNORMAL HIGH (ref 135–145)

## 2014-01-16 LAB — LDL CHOLESTEROL, DIRECT: Direct LDL: 125 mg/dL

## 2014-01-16 NOTE — Assessment & Plan Note (Signed)
Chronic problem.  Tolerating statin w/o difficulty.  Check labs.  Adjust meds prn  

## 2014-01-16 NOTE — Assessment & Plan Note (Signed)
Chronic problem.  Doing well on Ditropan XL BID.  Pt asking me to fill script so she no longer has to pay specialist copay for refills.  Script written.

## 2014-01-16 NOTE — Assessment & Plan Note (Signed)
Chronic problem.  Adequate control.  Asymptomatic.  Check labs.  No anticipated med changes 

## 2014-01-27 ENCOUNTER — Encounter: Payer: Self-pay | Admitting: Family Medicine

## 2014-02-11 ENCOUNTER — Other Ambulatory Visit: Payer: Self-pay | Admitting: *Deleted

## 2014-02-11 MED ORDER — ESCITALOPRAM OXALATE 10 MG PO TABS: 10.0000 mg | ORAL_TABLET | Freq: Every day | ORAL | Status: DC

## 2014-02-11 NOTE — Telephone Encounter (Signed)
Medication Detail      Disp Refills Start End     escitalopram (LEXAPRO) 10 MG tablet 30 tablet 3 09/10/2013     Sig: TAKE 1 TABLET BY MOUTH DAILY    E-Prescribing Status: Receipt confirmed by pharmacy (09/10/2013 10:13 AM EDT    LAST OV: 12.02.15 F/U: 6-Mths. Refill sent per [facility] refill protocol/SLS

## 2014-03-25 ENCOUNTER — Other Ambulatory Visit: Payer: Self-pay | Admitting: Dermatology

## 2014-04-01 ENCOUNTER — Other Ambulatory Visit: Payer: Self-pay | Admitting: Family Medicine

## 2014-04-01 NOTE — Telephone Encounter (Signed)
Med filled.  

## 2014-09-30 ENCOUNTER — Emergency Department (HOSPITAL_BASED_OUTPATIENT_CLINIC_OR_DEPARTMENT_OTHER)
Admission: EM | Admit: 2014-09-30 | Discharge: 2014-09-30 | Disposition: A | Discharge status: Home / Self Care | Payer: Managed Care, Other (non HMO) | Attending: Emergency Medicine | Admitting: Emergency Medicine

## 2014-09-30 ENCOUNTER — Encounter (HOSPITAL_BASED_OUTPATIENT_CLINIC_OR_DEPARTMENT_OTHER): Payer: Self-pay

## 2014-09-30 ENCOUNTER — Emergency Department (HOSPITAL_BASED_OUTPATIENT_CLINIC_OR_DEPARTMENT_OTHER): Payer: Managed Care, Other (non HMO)

## 2014-09-30 ENCOUNTER — Telehealth: Payer: Self-pay | Admitting: Family Medicine

## 2014-09-30 DIAGNOSIS — Z79899 Other long term (current) drug therapy: Secondary | ICD-10-CM | POA: Diagnosis not present

## 2014-09-30 DIAGNOSIS — Z87891 Personal history of nicotine dependence: Secondary | ICD-10-CM | POA: Insufficient documentation

## 2014-09-30 DIAGNOSIS — R1030 Lower abdominal pain, unspecified: Secondary | ICD-10-CM | POA: Insufficient documentation

## 2014-09-30 DIAGNOSIS — E785 Hyperlipidemia, unspecified: Secondary | ICD-10-CM | POA: Diagnosis not present

## 2014-09-30 DIAGNOSIS — I10 Essential (primary) hypertension: Secondary | ICD-10-CM | POA: Diagnosis not present

## 2014-09-30 LAB — URINALYSIS, ROUTINE W REFLEX MICROSCOPIC
BILIRUBIN URINE: NEGATIVE
GLUCOSE, UA: NEGATIVE mg/dL
HGB URINE DIPSTICK: NEGATIVE
Ketones, ur: 40 mg/dL — AB
Leukocytes, UA: NEGATIVE
Nitrite: NEGATIVE
PROTEIN: NEGATIVE mg/dL
Specific Gravity, Urine: 1.013 (ref 1.005–1.030)
UROBILINOGEN UA: 0.2 mg/dL (ref 0.0–1.0)
pH: 8 (ref 5.0–8.0)

## 2014-09-30 LAB — CBC WITH DIFFERENTIAL/PLATELET
BASOS ABS: 0 10*3/uL (ref 0.0–0.1)
BASOS PCT: 0 % (ref 0–1)
EOS ABS: 0.2 10*3/uL (ref 0.0–0.7)
Eosinophils Relative: 2 % (ref 0–5)
HCT: 43.4 % (ref 36.0–46.0)
HEMOGLOBIN: 14.9 g/dL (ref 12.0–15.0)
Lymphocytes Relative: 31 % (ref 12–46)
Lymphs Abs: 2.9 10*3/uL (ref 0.7–4.0)
MCH: 30 pg (ref 26.0–34.0)
MCHC: 34.3 g/dL (ref 30.0–36.0)
MCV: 87.5 fL (ref 78.0–100.0)
Monocytes Absolute: 0.8 10*3/uL (ref 0.1–1.0)
Monocytes Relative: 9 % (ref 3–12)
NEUTROS PCT: 58 % (ref 43–77)
Neutro Abs: 5.3 10*3/uL (ref 1.7–7.7)
Platelets: 259 10*3/uL (ref 150–400)
RBC: 4.96 MIL/uL (ref 3.87–5.11)
RDW: 13.6 % (ref 11.5–15.5)
WBC: 9.2 10*3/uL (ref 4.0–10.5)

## 2014-09-30 LAB — BASIC METABOLIC PANEL
Anion gap: 13 (ref 5–15)
BUN: 15 mg/dL (ref 6–20)
CALCIUM: 9.8 mg/dL (ref 8.9–10.3)
CHLORIDE: 103 mmol/L (ref 101–111)
CO2: 24 mmol/L (ref 22–32)
CREATININE: 0.65 mg/dL (ref 0.44–1.00)
GFR calc non Af Amer: >60 mL/min (ref >60)
Glucose, Bld: 132 mg/dL — ABNORMAL HIGH (ref 65–99)
Potassium: 3.4 mmol/L — ABNORMAL LOW (ref 3.5–5.1)
SODIUM: 140 mmol/L (ref 135–145)

## 2014-09-30 MED ORDER — SODIUM CHLORIDE 0.9 % IV SOLN
INTRAVENOUS | Status: DC
  Administered 2014-09-30: 06:00:00 via INTRAVENOUS

## 2014-09-30 MED ORDER — IOHEXOL 300 MG/ML  SOLN
100.0000 mL | Freq: Once | INTRAMUSCULAR | Status: AC | PRN
  Administered 2014-09-30: 100 mL via INTRAVENOUS

## 2014-09-30 MED ORDER — IOHEXOL 300 MG/ML  SOLN
25.0000 mL | Freq: Once | INTRAMUSCULAR | Status: AC | PRN
  Administered 2014-09-30: 25 mL via ORAL

## 2014-09-30 MED ORDER — ONDANSETRON HCL 4 MG/2ML IJ SOLN
4.0000 mg | Freq: Once | INTRAMUSCULAR | Status: AC
  Administered 2014-09-30: 4 mg via INTRAVENOUS
  Filled 2014-09-30: qty 2

## 2014-09-30 MED ORDER — HYDROCODONE-ACETAMINOPHEN 5-325 MG PO TABS: 1.0000 | ORAL_TABLET | ORAL | Status: DC | PRN

## 2014-09-30 MED ORDER — FENTANYL CITRATE (PF) 100 MCG/2ML IJ SOLN
100.0000 ug | Freq: Once | INTRAMUSCULAR | Status: AC
  Administered 2014-09-30: 100 ug via INTRAVENOUS
  Filled 2014-09-30: qty 2

## 2014-09-30 NOTE — ED Notes (Signed)
Pt c/o lower abdominal cramping x3hrs; states hx of constipation for past few weeks and using supplements to have a BM; denies vomiting

## 2014-09-30 NOTE — Telephone Encounter (Signed)
Noted  

## 2014-09-30 NOTE — Discharge Instructions (Signed)

## 2014-09-30 NOTE — ED Provider Notes (Signed)
CSN: [id number]     Arrival date & time 09/30/14  0532 History   First MD Initiated Contact with Patient 09/30/14 [phone]     Chief Complaint  Patient presents with  . Abdominal Cramping     (Consider location/radiation/quality/duration/timing/severity/associated sxs/prior Treatment) HPI  This is a 59 year old female who had an [place] intragastric balloon placed endoscopically in March of this year. She has had a significant weight loss since then and is due to have it removed in September. She has had constipation for the last 3 weeks and has been taking Dulcolax about once a week during that time period.  She is here having awakened from sleep about 3 AM with severe lower abdominal pain. The pain is constant but waxes and wanes. She rates it as 6 at the present time but it has been worse. There is a sharp and a crampy nature to the pain. Pain is worse with movement or palpation. She has nausea but no vomiting. She had a small hard bowel movement prior to arrival. She has not had fever, chills, dysuria, hematuria, hematochezia or melena.  Past Medical History  Diagnosis Date  . Hyperlipidemia   . Hypertension    Past Surgical History  Procedure Laterality Date  . Cholecystectomy  1999  . Abdominal hysterectomy  1995  . Tonia Brooms     Family History  Problem Relation Age of Onset  . Alcohol abuse Father   . Uterine cancer Mother   . Breast cancer Mother   . Heart disease Brother   . Hypertension Brother   . Diabetes Brother    Social History  Substance Use Topics  . Smoking status: Former Smoker -- 0.80 packs/day for 4 years    Types: Cigarettes    Quit date: 02/15/1983  . Smokeless tobacco: Never Used  . Alcohol Use: Yes   OB History    No data available     Review of Systems  All other systems reviewed and are negative.   Allergies  Review of patient's allergies indicates no known allergies.  Home Medications   Prior to Admission medications   Medication Sig Start  Date End Date Taking? Authorizing Provider  escitalopram (LEXAPRO) 10 MG tablet TAKE 1 TABLET BY MOUTH ONCE DAILY 04/01/14   Midge Minium, MD  hydrochlorothiazide (MICROZIDE) 12.5 MG capsule Take 1 capsule (12.5 mg total) by mouth daily. 01/01/14   Midge Minium, MD  HYDROcodone-acetaminophen (NORCO) 5-325 MG per tablet Take 1-2 tablets by mouth every 4 (four) hours as needed for severe pain. 09/30/14   Sherwood Gambler, MD  oxybutynin (DITROPAN-XL) 5 MG 24 hr tablet Take 2 tablets (10 mg total) by mouth at bedtime. 01/15/14   Midge Minium, MD  rosuvastatin (CRESTOR) 5 MG tablet Take 1 tablet (5 mg total) by mouth daily. 12/02/13   Midge Minium, MD   BP 150/98 mmHg  Pulse 52  Temp(Src) 98.2 F (36.8 C) (Oral)  Resp 20  Ht 5\' 5"  (1.651 m)  Wt 165 lb (74.844 kg)  BMI 27.46 kg/m2  SpO2 100%  LMP 02/14/1993   Physical Exam  General: Well-developed, well-nourished female in no acute distress; appearance consistent with age of record HENT: normocephalic; atraumatic Eyes: pupils equal, round and reactive to light; extraocular muscles intact Neck: supple Heart: regular rate and rhythm Lungs: clear to auscultation bilaterally Abdomen: soft; nondistended; diffusely tender most prominent in the suprapubic and right lower quadrant regions; no masses or hepatosplenomegaly; bowel sounds hypoactive Extremities: No deformity; full  range of motion; pulses normal Neurologic: Awake, alert and oriented; motor function intact in all extremities and symmetric; no facial droop Skin: Warm and dry Psychiatric: Flat affect    ED Course  Procedures (including critical care time)   MDM  Nursing notes and vitals signs, including pulse oximetry, reviewed.  Summary of this visit's results, reviewed by myself:  Labs:  Results for orders placed or performed during the hospital encounter of 09/30/14 (from the past 24 hour(s))  CBC with Differential/Platelet     Status: None   Collection  Time: 09/30/14  6:21 AM  Result Value Ref Range   WBC 9.2 4.0 - 10.5 K/uL   RBC 4.96 3.87 - 5.11 MIL/uL   Hemoglobin 14.9 12.0 - 15.0 g/dL   HCT 43.4 36.0 - 46.0 %   MCV 87.5 78.0 - 100.0 fL   MCH 30.0 26.0 - 34.0 pg   MCHC 34.3 30.0 - 36.0 g/dL   RDW 13.6 11.5 - 15.5 %   Platelets 259 150 - 400 K/uL   Neutrophils Relative % 58 43 - 77 %   Neutro Abs 5.3 1.7 - 7.7 K/uL   Lymphocytes Relative 31 12 - 46 %   Lymphs Abs 2.9 0.7 - 4.0 K/uL   Monocytes Relative 9 3 - 12 %   Monocytes Absolute 0.8 0.1 - 1.0 K/uL   Eosinophils Relative 2 0 - 5 %   Eosinophils Absolute 0.2 0.0 - 0.7 K/uL   Basophils Relative 0 0 - 1 %   Basophils Absolute 0.0 0.0 - 0.1 K/uL  Basic metabolic panel     Status: Abnormal   Collection Time: 09/30/14  6:21 AM  Result Value Ref Range   Sodium 140 135 - 145 mmol/L   Potassium 3.4 (L) 3.5 - 5.1 mmol/L   Chloride 103 101 - 111 mmol/L   CO2 24 22 - 32 mmol/L   Glucose, Bld 132 (H) 65 - 99 mg/dL   BUN 15 6 - 20 mg/dL   Creatinine, Ser 0.65 0.44 - 1.00 mg/dL   Calcium 9.8 8.9 - 10.3 mg/dL   GFR calc non Af Amer >60 >60 mL/min   GFR calc Af Amer >60 >60 mL/min   Anion gap 13 5 - 15  Urinalysis, Routine w reflex microscopic (not at [facility])     Status: Abnormal   Collection Time: 09/30/14  6:45 AM  Result Value Ref Range   Color, Urine YELLOW YELLOW   APPearance CLEAR CLEAR   Specific Gravity, Urine 1.013 1.005 - 1.030   pH 8.0 5.0 - 8.0   Glucose, UA NEGATIVE NEGATIVE mg/dL   Hgb urine dipstick NEGATIVE NEGATIVE   Bilirubin Urine NEGATIVE NEGATIVE   Ketones, ur 40 (A) NEGATIVE mg/dL   Protein, ur NEGATIVE NEGATIVE mg/dL   Urobilinogen, UA 0.2 0.0 - 1.0 mg/dL   Nitrite NEGATIVE NEGATIVE   Leukocytes, UA NEGATIVE NEGATIVE    Imaging Studies: Ct Abdomen Pelvis W Contrast  09/30/2014   CLINICAL DATA:  Lower abdominal cramping for 3 hours. History of constipation.  EXAM: CT ABDOMEN AND PELVIS WITH CONTRAST  TECHNIQUE: Multidetector CT imaging of the abdomen  and pelvis was performed using the standard protocol following bolus administration of intravenous contrast.  CONTRAST:  72mL OMNIPAQUE IOHEXOL 300 MG/ML SOLN, 160mL OMNIPAQUE IOHEXOL 300 MG/ML SOLN  COMPARISON:  None.  FINDINGS: Lung bases are clear.  Negative for free air.  There is a large fluid-filled device within the distal stomach. Structure measures 8.2 x 10.5 x  10.2 cm and makes contact with anterior and posterior walls of the stomach. This is compatible with an [place] weight loss balloon. Moderate distention of the proximal stomach. There is contrast in the duodenum. There is no significant wall thickening or inflammation around the stomach.  The gallbladder has been removed. Normal appearance of the liver. Portal venous system is patent. Normal appearance of the pancreas, spleen, adrenal glands and both kidneys. There is no significant free fluid or lymphadenopathy.  The uterus is absent. Evidence for left ovarian tissue. Urinary bladder is decompressed. No acute abnormality to the small bowel, large bowel or appendix.  There is disc space loss at L5-S1.  No acute bone abnormality.  IMPRESSION: No acute abnormality in the abdomen or pelvis.  There is a weight loss balloon positioned in the distal stomach. There is moderate distention of the stomach but no inflammatory changes.   Electronically Signed   By: Markus Daft M.D.   On: 09/30/2014 07:41       Shanon Rosser, MD 09/30/14 [id number]

## 2014-09-30 NOTE — Telephone Encounter (Signed)
Pt called in for ER follow up, she was seen this morning [facility] ER for abdominal pain. Scheduled for 10/07/14 11:00am with Dr. Birdie Riddle.

## 2014-09-30 NOTE — ED Provider Notes (Signed)
7:41 AM no obvious etiology for the patient's pain on the CT scan. Patient's pain is much better although still present at a 3/10. She feels much more comfortable and I have discussed discharge planning as well as return precautions. She will follow-up with her PCP tomorrow if symptoms remain. She has follow up with her surgeon in 2 weeks.  Results for orders placed or performed during the hospital encounter of 09/30/14  CBC with Differential/Platelet  Result Value Ref Range   WBC 9.2 4.0 - 10.5 K/uL   RBC 4.96 3.87 - 5.11 MIL/uL   Hemoglobin 14.9 12.0 - 15.0 g/dL   HCT 43.4 36.0 - 46.0 %   MCV 87.5 78.0 - 100.0 fL   MCH 30.0 26.0 - 34.0 pg   MCHC 34.3 30.0 - 36.0 g/dL   RDW 13.6 11.5 - 15.5 %   Platelets 259 150 - 400 K/uL   Neutrophils Relative % 58 43 - 77 %   Neutro Abs 5.3 1.7 - 7.7 K/uL   Lymphocytes Relative 31 12 - 46 %   Lymphs Abs 2.9 0.7 - 4.0 K/uL   Monocytes Relative 9 3 - 12 %   Monocytes Absolute 0.8 0.1 - 1.0 K/uL   Eosinophils Relative 2 0 - 5 %   Eosinophils Absolute 0.2 0.0 - 0.7 K/uL   Basophils Relative 0 0 - 1 %   Basophils Absolute 0.0 0.0 - 0.1 K/uL  Basic metabolic panel  Result Value Ref Range   Sodium 140 135 - 145 mmol/L   Potassium 3.4 (L) 3.5 - 5.1 mmol/L   Chloride 103 101 - 111 mmol/L   CO2 24 22 - 32 mmol/L   Glucose, Bld 132 (H) 65 - 99 mg/dL   BUN 15 6 - 20 mg/dL   Creatinine, Ser 0.65 0.44 - 1.00 mg/dL   Calcium 9.8 8.9 - 10.3 mg/dL   GFR calc non Af Amer >60 >60 mL/min   GFR calc Af Amer >60 >60 mL/min   Anion gap 13 5 - 15  Urinalysis, Routine w reflex microscopic (not at [facility])  Result Value Ref Range   Color, Urine YELLOW YELLOW   APPearance CLEAR CLEAR   Specific Gravity, Urine 1.013 1.005 - 1.030   pH 8.0 5.0 - 8.0   Glucose, UA NEGATIVE NEGATIVE mg/dL   Hgb urine dipstick NEGATIVE NEGATIVE   Bilirubin Urine NEGATIVE NEGATIVE   Ketones, ur 40 (A) NEGATIVE mg/dL   Protein, ur NEGATIVE NEGATIVE mg/dL   Urobilinogen, UA 0.2 0.0 -  1.0 mg/dL   Nitrite NEGATIVE NEGATIVE   Leukocytes, UA NEGATIVE NEGATIVE   Ct Abdomen Pelvis W Contrast  09/30/2014   CLINICAL DATA:  Lower abdominal cramping for 3 hours. History of constipation.  EXAM: CT ABDOMEN AND PELVIS WITH CONTRAST  TECHNIQUE: Multidetector CT imaging of the abdomen and pelvis was performed using the standard protocol following bolus administration of intravenous contrast.  CONTRAST:  24mL OMNIPAQUE IOHEXOL 300 MG/ML SOLN, 184mL OMNIPAQUE IOHEXOL 300 MG/ML SOLN  COMPARISON:  None.  FINDINGS: Lung bases are clear.  Negative for free air.  There is a large fluid-filled device within the distal stomach. Structure measures 8.2 x 10.5 x 10.2 cm and makes contact with anterior and posterior walls of the stomach. This is compatible with an [place] weight loss balloon. Moderate distention of the proximal stomach. There is contrast in the duodenum. There is no significant wall thickening or inflammation around the stomach.  The gallbladder has been removed. Normal appearance of  the liver. Portal venous system is patent. Normal appearance of the pancreas, spleen, adrenal glands and both kidneys. There is no significant free fluid or lymphadenopathy.  The uterus is absent. Evidence for left ovarian tissue. Urinary bladder is decompressed. No acute abnormality to the small bowel, large bowel or appendix.  There is disc space loss at L5-S1.  No acute bone abnormality.  IMPRESSION: No acute abnormality in the abdomen or pelvis.  There is a weight loss balloon positioned in the distal stomach. There is moderate distention of the stomach but no inflammatory changes.   Electronically Signed   By: Markus Daft M.D.   On: 09/30/2014 07:41      Sherwood Gambler, MD 09/30/14 [phone]

## 2014-10-07 ENCOUNTER — Ambulatory Visit (INDEPENDENT_AMBULATORY_CARE_PROVIDER_SITE_OTHER): Payer: Managed Care, Other (non HMO) | Admitting: Family Medicine

## 2014-10-07 ENCOUNTER — Encounter: Payer: Self-pay | Admitting: Family Medicine

## 2014-10-07 VITALS — BP 138/76 | HR 88 | Temp 98.2°F | Resp 16 | Ht 65.0 in | Wt 167.0 lb

## 2014-10-07 DIAGNOSIS — E785 Hyperlipidemia, unspecified: Secondary | ICD-10-CM

## 2014-10-07 DIAGNOSIS — E669 Obesity, unspecified: Secondary | ICD-10-CM

## 2014-10-07 DIAGNOSIS — I10 Essential (primary) hypertension: Secondary | ICD-10-CM

## 2014-10-07 LAB — CBC WITH DIFFERENTIAL/PLATELET
Basophils Absolute: 0.1 10*3/uL (ref 0.0–0.1)
Basophils Relative: 0.5 % (ref 0.0–3.0)
Eosinophils Absolute: 0.1 10*3/uL (ref 0.0–0.7)
Eosinophils Relative: 1 % (ref 0.0–5.0)
HCT: 43.3 % (ref 36.0–46.0)
Hemoglobin: 14.7 g/dL (ref 12.0–15.0)
LYMPHS ABS: 2.6 10*3/uL (ref 0.7–4.0)
Lymphocytes Relative: 21.2 % (ref 12.0–46.0)
MCHC: 33.9 g/dL (ref 30.0–36.0)
MCV: 88.6 fl (ref 78.0–100.0)
MONOS PCT: 6.5 % (ref 3.0–12.0)
Monocytes Absolute: 0.8 10*3/uL (ref 0.1–1.0)
NEUTROS ABS: 8.7 10*3/uL — AB (ref 1.4–7.7)
NEUTROS PCT: 70.8 % (ref 43.0–77.0)
PLATELETS: 295 10*3/uL (ref 150.0–400.0)
RBC: 4.88 Mil/uL (ref 3.87–5.11)
RDW: 13.6 % (ref 11.5–15.5)
WBC: 12.3 10*3/uL — ABNORMAL HIGH (ref 4.0–10.5)

## 2014-10-07 LAB — BASIC METABOLIC PANEL WITH GFR
BUN: 18 mg/dL (ref 6–23)
CO2: 26 meq/L (ref 19–32)
Calcium: 9.7 mg/dL (ref 8.4–10.5)
Chloride: 104 meq/L (ref 96–112)
Creatinine, Ser: 0.58 mg/dL (ref 0.40–1.20)
GFR: 112.66 mL/min
Glucose, Bld: 105 mg/dL — ABNORMAL HIGH (ref 70–99)
Potassium: 3.8 meq/L (ref 3.5–5.1)
Sodium: 138 meq/L (ref 135–145)

## 2014-10-07 LAB — HEPATIC FUNCTION PANEL
ALT: 23 U/L (ref 0–35)
AST: 21 U/L (ref 0–37)
Albumin: 4.4 g/dL (ref 3.5–5.2)
Alkaline Phosphatase: 69 U/L (ref 39–117)
Bilirubin, Direct: 0.1 mg/dL (ref 0.0–0.3)
Total Bilirubin: 0.8 mg/dL (ref 0.2–1.2)
Total Protein: 7.5 g/dL (ref 6.0–8.3)

## 2014-10-07 LAB — LIPID PANEL
Cholesterol: 188 mg/dL (ref 0–200)
HDL: 42.9 mg/dL (ref >39.00)
LDL CALC: 123 mg/dL — AB (ref 0–99)
NONHDL: 144.69
Total CHOL/HDL Ratio: 4
Triglycerides: 107 mg/dL (ref 0.0–149.0)
VLDL: 21.4 mg/dL (ref 0.0–40.0)

## 2014-10-07 NOTE — Assessment & Plan Note (Signed)
Chronic problem.  Pt has stopped Lipitor after losing 40 lbs.  Check labs and restart meds as needed.  Applauded her efforts.  Will continue to follow.

## 2014-10-07 NOTE — Patient Instructions (Signed)
Schedule your complete physical in 6 months We'll notify you of your lab results and make any changes if needed Keep up the good work on healthy diet and regular exercise Call with any questions or concerns Happy Labor Day!!!

## 2014-10-07 NOTE — Assessment & Plan Note (Signed)
Pt's BP is adequately controlled off medication after losing 40 lbs!  Applauded her efforts at healthy diet and regular exercise.  Officially stop HCTZ.  Check labs.  Will continue to follow.

## 2014-10-07 NOTE — Progress Notes (Signed)
Pre visit review using our clinic review tool, if applicable. No additional management support is needed unless otherwise documented below in the visit note. 

## 2014-10-07 NOTE — Assessment & Plan Note (Signed)
Pt has lost 40 lbs since December!  Applauded her efforts and congratulated her on her results.  Pt is now off BP meds and hoping to stop cholesterol meds.  Check labs.  Will continue to follow.

## 2014-10-07 NOTE — Progress Notes (Signed)
   Subjective:    Patient ID: Sherry Ross, female    DOB: Jul 08, 1955, 59 y.o.   MRN: [id number]  HPI HTN- chronic problem.  Pt is off all meds after losing 45 lbs since Dec.  BP is controlled w/o medication.  No CP, SOB, HAs, visual changes, edema.  Hyperlipidemia- chronic problem, stopped the Crestor after losing 45 lbs.  Exercising regularly- working out w/ Physiological scientist.  Has completely changed diet.   Review of Systems For ROS see HPI     Objective:   Physical Exam  Constitutional: She is oriented to person, place, and time. She appears well-developed and well-nourished. No distress.  HENT:  Head: Normocephalic and atraumatic.  Eyes: Conjunctivae and EOM are normal. Pupils are equal, round, and reactive to light.  Neck: Normal range of motion. Neck supple. No thyromegaly present.  Cardiovascular: Normal rate, regular rhythm, normal heart sounds and intact distal pulses.   No murmur heard. Pulmonary/Chest: Effort normal and breath sounds normal. No respiratory distress.  Abdominal: Soft. She exhibits no distension. There is no tenderness.  Musculoskeletal: She exhibits no edema.  Lymphadenopathy:    She has no cervical adenopathy.  Neurological: She is alert and oriented to person, place, and time.  Skin: Skin is warm and dry.  Psychiatric: She has a normal mood and affect. Her behavior is normal.  Vitals reviewed.         Assessment & Plan:

## 2014-10-08 ENCOUNTER — Other Ambulatory Visit: Payer: Self-pay | Admitting: Family Medicine

## 2014-10-08 DIAGNOSIS — D72829 Elevated white blood cell count, unspecified: Secondary | ICD-10-CM

## 2014-10-21 ENCOUNTER — Other Ambulatory Visit: Payer: Managed Care, Other (non HMO)

## 2014-10-28 ENCOUNTER — Other Ambulatory Visit (INDEPENDENT_AMBULATORY_CARE_PROVIDER_SITE_OTHER): Payer: Managed Care, Other (non HMO)

## 2014-10-28 DIAGNOSIS — D72829 Elevated white blood cell count, unspecified: Secondary | ICD-10-CM

## 2014-10-28 LAB — CBC WITH DIFFERENTIAL/PLATELET
BASOS PCT: 0.6 % (ref 0.0–3.0)
Basophils Absolute: 0 10*3/uL (ref 0.0–0.1)
EOS ABS: 0.3 10*3/uL (ref 0.0–0.7)
EOS PCT: 3.9 % (ref 0.0–5.0)
HEMATOCRIT: 42.5 % (ref 36.0–46.0)
HEMOGLOBIN: 14.2 g/dL (ref 12.0–15.0)
LYMPHS PCT: 33.4 % (ref 12.0–46.0)
Lymphs Abs: 2.3 10*3/uL (ref 0.7–4.0)
MCHC: 33.4 g/dL (ref 30.0–36.0)
MCV: 89.6 fl (ref 78.0–100.0)
MONOS PCT: 7.7 % (ref 3.0–12.0)
Monocytes Absolute: 0.5 10*3/uL (ref 0.1–1.0)
Neutro Abs: 3.7 10*3/uL (ref 1.4–7.7)
Neutrophils Relative %: 54.4 % (ref 43.0–77.0)
Platelets: 297 10*3/uL (ref 150.0–400.0)
RBC: 4.75 Mil/uL (ref 3.87–5.11)
RDW: 13.3 % (ref 11.5–15.5)
WBC: 6.8 10*3/uL (ref 4.0–10.5)

## 2015-02-15 LAB — HM MAMMOGRAPHY: HM MAMMO: NORMAL

## 2015-03-02 LAB — HM MAMMOGRAPHY

## 2015-03-10 ENCOUNTER — Encounter: Payer: Self-pay | Admitting: General Practice

## 2015-04-08 ENCOUNTER — Encounter: Payer: Self-pay | Admitting: Behavioral Health

## 2015-04-08 ENCOUNTER — Telehealth: Payer: Self-pay | Admitting: Behavioral Health

## 2015-04-08 NOTE — Telephone Encounter (Signed)
Pre-Visit Call completed with patient and chart updated.   Pre-Visit Info documented in Specialty Comments under [employer].    

## 2015-04-09 ENCOUNTER — Encounter: Payer: Self-pay | Admitting: Family Medicine

## 2015-04-09 ENCOUNTER — Encounter: Payer: Managed Care, Other (non HMO) | Admitting: Family Medicine

## 2015-04-09 ENCOUNTER — Ambulatory Visit (INDEPENDENT_AMBULATORY_CARE_PROVIDER_SITE_OTHER): Payer: Managed Care, Other (non HMO) | Admitting: Family Medicine

## 2015-04-09 VITALS — BP 110/74 | HR 74 | Temp 98.4°F | Ht 65.0 in | Wt 163.4 lb

## 2015-04-09 DIAGNOSIS — Z23 Encounter for immunization: Secondary | ICD-10-CM

## 2015-04-09 DIAGNOSIS — Z1159 Encounter for screening for other viral diseases: Secondary | ICD-10-CM | POA: Diagnosis not present

## 2015-04-09 DIAGNOSIS — F418 Other specified anxiety disorders: Secondary | ICD-10-CM

## 2015-04-09 DIAGNOSIS — Z0000 Encounter for general adult medical examination without abnormal findings: Secondary | ICD-10-CM

## 2015-04-09 LAB — TSH: TSH: 1.61 u[IU]/mL (ref 0.35–4.50)

## 2015-04-09 LAB — LIPID PANEL
CHOL/HDL RATIO: 4
Cholesterol: 196 mg/dL (ref 0–200)
HDL: 55 mg/dL (ref >39.00)
LDL CALC: 118 mg/dL — AB (ref 0–99)
NONHDL: 140.78
Triglycerides: 112 mg/dL (ref 0.0–149.0)
VLDL: 22.4 mg/dL (ref 0.0–40.0)

## 2015-04-09 LAB — POCT URINALYSIS DIPSTICK
Bilirubin, UA: NEGATIVE
Glucose, UA: NEGATIVE
Ketones, UA: NEGATIVE
Leukocytes, UA: NEGATIVE
Nitrite, UA: NEGATIVE
PROTEIN UA: NEGATIVE
RBC UA: NEGATIVE
UROBILINOGEN UA: 0.2
pH, UA: 6

## 2015-04-09 LAB — CBC WITH DIFFERENTIAL/PLATELET
BASOS PCT: 0.5 % (ref 0.0–3.0)
Basophils Absolute: 0 10*3/uL (ref 0.0–0.1)
EOS PCT: 4.7 % (ref 0.0–5.0)
Eosinophils Absolute: 0.3 10*3/uL (ref 0.0–0.7)
HEMATOCRIT: 42.2 % (ref 36.0–46.0)
HEMOGLOBIN: 14.4 g/dL (ref 12.0–15.0)
LYMPHS PCT: 31 % (ref 12.0–46.0)
Lymphs Abs: 1.9 10*3/uL (ref 0.7–4.0)
MCHC: 34.1 g/dL (ref 30.0–36.0)
MCV: 91 fl (ref 78.0–100.0)
MONO ABS: 0.5 10*3/uL (ref 0.1–1.0)
Monocytes Relative: 7.8 % (ref 3.0–12.0)
Neutro Abs: 3.5 10*3/uL (ref 1.4–7.7)
Neutrophils Relative %: 56 % (ref 43.0–77.0)
Platelets: 233 10*3/uL (ref 150.0–400.0)
RBC: 4.63 Mil/uL (ref 3.87–5.11)
RDW: 12.9 % (ref 11.5–15.5)
WBC: 6.2 10*3/uL (ref 4.0–10.5)

## 2015-04-09 LAB — COMPREHENSIVE METABOLIC PANEL
ALBUMIN: 4.6 g/dL (ref 3.5–5.2)
ALT: 17 U/L (ref 0–35)
AST: 19 U/L (ref 0–37)
Alkaline Phosphatase: 62 U/L (ref 39–117)
BUN: 16 mg/dL (ref 6–23)
CHLORIDE: 105 meq/L (ref 96–112)
CO2: 29 mEq/L (ref 19–32)
Calcium: 9.7 mg/dL (ref 8.4–10.5)
Creatinine, Ser: 0.57 mg/dL (ref 0.40–1.20)
GFR: 114.74 mL/min (ref >60.00)
Glucose, Bld: 100 mg/dL — ABNORMAL HIGH (ref 70–99)
POTASSIUM: 4.3 meq/L (ref 3.5–5.1)
SODIUM: 140 meq/L (ref 135–145)
Total Bilirubin: 0.7 mg/dL (ref 0.2–1.2)
Total Protein: 7.7 g/dL (ref 6.0–8.3)

## 2015-04-09 MED ORDER — ALPRAZOLAM 0.25 MG PO TABS: 0.2500 mg | ORAL_TABLET | Freq: Three times a day (TID) | ORAL | Status: DC | PRN

## 2015-04-09 NOTE — Patient Instructions (Signed)
Preventive Care for Adults, Female A healthy lifestyle and preventive care can promote health and wellness. Preventive health guidelines for women include the following key practices.  A routine yearly physical is a good way to check with your health care provider about your health and preventive screening. It is a chance to share any concerns and updates on your health and to receive a thorough exam.  Visit your dentist for a routine exam and preventive care every 6 months. Brush your teeth twice a day and floss once a day. Good oral hygiene prevents tooth decay and gum disease.  The frequency of eye exams is based on your age, health, family medical history, use of contact lenses, and other factors. Follow your health care provider's recommendations for frequency of eye exams.  Eat a healthy diet. Foods like vegetables, fruits, whole grains, low-fat dairy products, and lean protein foods contain the nutrients you need without too many calories. Decrease your intake of foods high in solid fats, added sugars, and salt. Eat the right amount of calories for you.Get information about a proper diet from your health care provider, if necessary.  Regular physical exercise is one of the most important things you can do for your health. Most adults should get at least 150 minutes of moderate-intensity exercise (any activity that increases your heart rate and causes you to sweat) each week. In addition, most adults need muscle-strengthening exercises on 2 or more days a week.  Maintain a healthy weight. The body mass index (BMI) is a screening tool to identify possible weight problems. It provides an estimate of body fat based on height and weight. Your health care provider can find your BMI and can help you achieve or maintain a healthy weight.For adults 20 years and older:  A BMI below 18.5 is considered underweight.  A BMI of 18.5 to 24.9 is normal.  A BMI of 25 to 29.9 is considered overweight.  A  BMI of 30 and above is considered obese.  Maintain normal blood lipids and cholesterol levels by exercising and minimizing your intake of saturated fat. Eat a balanced diet with plenty of fruit and vegetables. Blood tests for lipids and cholesterol should begin at age 45 and be repeated every 5 years. If your lipid or cholesterol levels are high, you are over 50, or you are at high risk for heart disease, you may need your cholesterol levels checked more frequently.Ongoing high lipid and cholesterol levels should be treated with medicines if diet and exercise are not working.  If you smoke, find out from your health care provider how to quit. If you do not use tobacco, do not start.  Lung cancer screening is recommended for adults aged 45-80 years who are at high risk for developing lung cancer because of a history of smoking. A yearly low-dose CT scan of the lungs is recommended for people who have at least a 30-pack-year history of smoking and are a current smoker or have quit within the past 15 years. A pack year of smoking is smoking an average of 1 pack of cigarettes a day for 1 year (for example: 1 pack a day for 30 years or 2 packs a day for 15 years). Yearly screening should continue until the smoker has stopped smoking for at least 15 years. Yearly screening should be stopped for people who develop a health problem that would prevent them from having lung cancer treatment.  If you are pregnant, do not drink alcohol. If you are  breastfeeding, be very cautious about drinking alcohol. If you are not pregnant and choose to drink alcohol, do not have more than 1 drink per day. One drink is considered to be 12 ounces (355 mL) of beer, 5 ounces (148 mL) of wine, or 1.5 ounces (44 mL) of liquor.  Avoid use of street drugs. Do not share needles with anyone. Ask for help if you need support or instructions about stopping the use of drugs.  High blood pressure causes heart disease and increases the risk  of stroke. Your blood pressure should be checked at least every 1 to 2 years. Ongoing high blood pressure should be treated with medicines if weight loss and exercise do not work.  If you are 55-79 years old, ask your health care provider if you should take aspirin to prevent strokes.  Diabetes screening is done by taking a blood sample to check your blood glucose level after you have not eaten for a certain period of time (fasting). If you are not overweight and you do not have risk factors for diabetes, you should be screened once every 3 years starting at age 45. If you are overweight or obese and you are 40-70 years of age, you should be screened for diabetes every year as part of your cardiovascular risk assessment.  Breast cancer screening is essential preventive care for women. You should practice "breast self-awareness." This means understanding the normal appearance and feel of your breasts and may include breast self-examination. Any changes detected, no matter how small, should be reported to a health care provider. Women in their 20s and 30s should have a clinical breast exam (CBE) by a health care provider as part of a regular health exam every 1 to 3 years. After age 40, women should have a CBE every year. Starting at age 40, women should consider having a mammogram (breast X-ray test) every year. Women who have a family history of breast cancer should talk to their health care provider about genetic screening. Women at a high risk of breast cancer should talk to their health care providers about having an MRI and a mammogram every year.  Breast cancer gene (BRCA)-related cancer risk assessment is recommended for women who have family members with BRCA-related cancers. BRCA-related cancers include breast, ovarian, tubal, and peritoneal cancers. Having family members with these cancers may be associated with an increased risk for harmful changes (mutations) in the breast cancer genes BRCA1 and  BRCA2. Results of the assessment will determine the need for genetic counseling and BRCA1 and BRCA2 testing.  Your health care provider may recommend that you be screened regularly for cancer of the pelvic organs (ovaries, uterus, and vagina). This screening involves a pelvic examination, including checking for microscopic changes to the surface of your cervix (Pap test). You may be encouraged to have this screening done every 3 years, beginning at age 21.  For women ages 30-65, health care providers may recommend pelvic exams and Pap testing every 3 years, or they may recommend the Pap and pelvic exam, combined with testing for human papilloma virus (HPV), every 5 years. Some types of HPV increase your risk of cervical cancer. Testing for HPV may also be done on women of any age with unclear Pap test results.  Other health care providers may not recommend any screening for nonpregnant women who are considered low risk for pelvic cancer and who do not have symptoms. Ask your health care provider if a screening pelvic exam is right for   you.  If you have had past treatment for cervical cancer or a condition that could lead to cancer, you need Pap tests and screening for cancer for at least 20 years after your treatment. If Pap tests have been discontinued, your risk factors (such as having a new sexual partner) need to be reassessed to determine if screening should resume. Some women have medical problems that increase the chance of getting cervical cancer. In these cases, your health care provider may recommend more frequent screening and Pap tests.  Colorectal cancer can be detected and often prevented. Most routine colorectal cancer screening begins at the age of 50 years and continues through age 75 years. However, your health care provider may recommend screening at an earlier age if you have risk factors for colon cancer. On a yearly basis, your health care provider may provide home test kits to check  for hidden blood in the stool. Use of a small camera at the end of a tube, to directly examine the colon (sigmoidoscopy or colonoscopy), can detect the earliest forms of colorectal cancer. Talk to your health care provider about this at age 50, when routine screening begins. Direct exam of the colon should be repeated every 5-10 years through age 75 years, unless early forms of precancerous polyps or small growths are found.  People who are at an increased risk for hepatitis B should be screened for this virus. You are considered at high risk for hepatitis B if:  You were born in a country where hepatitis B occurs often. Talk with your health care provider about which countries are considered high risk.  Your parents were born in a high-risk country and you have not received a shot to protect against hepatitis B (hepatitis B vaccine).  You have HIV or AIDS.  You use needles to inject street drugs.  You live with, or have sex with, someone who has hepatitis B.  You get hemodialysis treatment.  You take certain medicines for conditions like cancer, organ transplantation, and autoimmune conditions.  Hepatitis C blood testing is recommended for all people born from 1945 through 1965 and any individual with known risks for hepatitis C.  Practice safe sex. Use condoms and avoid high-risk sexual practices to reduce the spread of sexually transmitted infections (STIs). STIs include gonorrhea, chlamydia, syphilis, trichomonas, herpes, HPV, and human immunodeficiency virus (HIV). Herpes, HIV, and HPV are viral illnesses that have no cure. They can result in disability, cancer, and death.  You should be screened for sexually transmitted illnesses (STIs) including gonorrhea and chlamydia if:  You are sexually active and are younger than 24 years.  You are older than 24 years and your health care provider tells you that you are at risk for this type of infection.  Your sexual activity has changed  since you were last screened and you are at an increased risk for chlamydia or gonorrhea. Ask your health care provider if you are at risk.  If you are at risk of being infected with HIV, it is recommended that you take a prescription medicine daily to prevent HIV infection. This is called preexposure prophylaxis (PrEP). You are considered at risk if:  You are sexually active and do not regularly use condoms or know the HIV status of your partner(s).  You take drugs by injection.  You are sexually active with a partner who has HIV.  Talk with your health care provider about whether you are at high risk of being infected with HIV. If   you choose to begin PrEP, you should first be tested for HIV. You should then be tested every 3 months for as long as you are taking PrEP.  Osteoporosis is a disease in which the bones lose minerals and strength with aging. This can result in serious bone fractures or breaks. The risk of osteoporosis can be identified using a bone density scan. Women ages 67 years and over and women at risk for fractures or osteoporosis should discuss screening with their health care providers. Ask your health care provider whether you should take a calcium supplement or vitamin D to reduce the rate of osteoporosis.  Menopause can be associated with physical symptoms and risks. Hormone replacement therapy is available to decrease symptoms and risks. You should talk to your health care provider about whether hormone replacement therapy is right for you.  Use sunscreen. Apply sunscreen liberally and repeatedly throughout the day. You should seek shade when your shadow is shorter than you. Protect yourself by wearing long sleeves, pants, a wide-brimmed hat, and sunglasses year round, whenever you are outdoors.  Once a month, do a whole body skin exam, using a mirror to look at the skin on your back. Tell your health care provider of new moles, moles that have irregular borders, moles that  are larger than a pencil eraser, or moles that have changed in shape or color.  Stay current with required vaccines (immunizations).  Influenza vaccine. All adults should be immunized every year.  Tetanus, diphtheria, and acellular pertussis (Td, Tdap) vaccine. Pregnant women should receive 1 dose of Tdap vaccine during each pregnancy. The dose should be obtained regardless of the length of time since the last dose. Immunization is preferred during the 27th-36th week of gestation. An adult who has not previously received Tdap or who does not know her vaccine status should receive 1 dose of Tdap. This initial dose should be followed by tetanus and diphtheria toxoids (Td) booster doses every 10 years. Adults with an unknown or incomplete history of completing a 3-dose immunization series with Td-containing vaccines should begin or complete a primary immunization series including a Tdap dose. Adults should receive a Td booster every 10 years.  Varicella vaccine. An adult without evidence of immunity to varicella should receive 2 doses or a second dose if she has previously received 1 dose. Pregnant females who do not have evidence of immunity should receive the first dose after pregnancy. This first dose should be obtained before leaving the health care facility. The second dose should be obtained 4-8 weeks after the first dose.  Human papillomavirus (HPV) vaccine. Females aged 13-26 years who have not received the vaccine previously should obtain the 3-dose series. The vaccine is not recommended for use in pregnant females. However, pregnancy testing is not needed before receiving a dose. If a female is found to be pregnant after receiving a dose, no treatment is needed. In that case, the remaining doses should be delayed until after the pregnancy. Immunization is recommended for any person with an immunocompromised condition through the age of 61 years if she did not get any or all doses earlier. During the  3-dose series, the second dose should be obtained 4-8 weeks after the first dose. The third dose should be obtained 24 weeks after the first dose and 16 weeks after the second dose.  Zoster vaccine. One dose is recommended for adults aged 30 years or older unless certain conditions are present.  Measles, mumps, and rubella (MMR) vaccine. Adults born  before 1957 generally are considered immune to measles and mumps. Adults born in 1957 or later should have 1 or more doses of MMR vaccine unless there is a contraindication to the vaccine or there is laboratory evidence of immunity to each of the three diseases. A routine second dose of MMR vaccine should be obtained at least 28 days after the first dose for students attending postsecondary schools, health care workers, or international travelers. People who received inactivated measles vaccine or an unknown type of measles vaccine during 1963-1967 should receive 2 doses of MMR vaccine. People who received inactivated mumps vaccine or an unknown type of mumps vaccine before 1979 and are at high risk for mumps infection should consider immunization with 2 doses of MMR vaccine. For females of childbearing age, rubella immunity should be determined. If there is no evidence of immunity, females who are not pregnant should be vaccinated. If there is no evidence of immunity, females who are pregnant should delay immunization until after pregnancy. Unvaccinated health care workers born before 1957 who lack laboratory evidence of measles, mumps, or rubella immunity or laboratory confirmation of disease should consider measles and mumps immunization with 2 doses of MMR vaccine or rubella immunization with 1 dose of MMR vaccine.  Pneumococcal 13-valent conjugate (PCV13) vaccine. When indicated, a person who is uncertain of his immunization history and has no record of immunization should receive the PCV13 vaccine. All adults 65 years of age and older should receive this  vaccine. An adult aged 19 years or older who has certain medical conditions and has not been previously immunized should receive 1 dose of PCV13 vaccine. This PCV13 should be followed with a dose of pneumococcal polysaccharide (PPSV23) vaccine. Adults who are at high risk for pneumococcal disease should obtain the PPSV23 vaccine at least 8 weeks after the dose of PCV13 vaccine. Adults older than 61 years of age who have normal immune system function should obtain the PPSV23 vaccine dose at least 1 year after the dose of PCV13 vaccine.  Pneumococcal polysaccharide (PPSV23) vaccine. When PCV13 is also indicated, PCV13 should be obtained first. All adults aged 65 years and older should be immunized. An adult younger than age 65 years who has certain medical conditions should be immunized. Any person who resides in a nursing home or long-term care facility should be immunized. An adult smoker should be immunized. People with an immunocompromised condition and certain other conditions should receive both PCV13 and PPSV23 vaccines. People with human immunodeficiency virus (HIV) infection should be immunized as soon as possible after diagnosis. Immunization during chemotherapy or radiation therapy should be avoided. Routine use of PPSV23 vaccine is not recommended for American Indians, [place] Natives, or people younger than 65 years unless there are medical conditions that require PPSV23 vaccine. When indicated, people who have unknown immunization and have no record of immunization should receive PPSV23 vaccine. One-time revaccination 5 years after the first dose of PPSV23 is recommended for people aged 19-64 years who have chronic kidney failure, nephrotic syndrome, asplenia, or immunocompromised conditions. People who received 1-2 doses of PPSV23 before age 65 years should receive another dose of PPSV23 vaccine at age 65 years or later if at least 5 years have passed since the previous dose. Doses of PPSV23 are not  needed for people immunized with PPSV23 at or after age 65 years.  Meningococcal vaccine. Adults with asplenia or persistent complement component deficiencies should receive 2 doses of quadrivalent meningococcal conjugate (MenACWY-D) vaccine. The doses should be obtained   at least 2 months apart. Microbiologists working with certain meningococcal bacteria, [place] recruits, people at risk during an outbreak, and people who travel to or live in countries with a high rate of meningitis should be immunized. A first-year college student up through age 34 years who is living in a residence hall should receive a dose if she did not receive a dose on or after her 16th birthday. Adults who have certain high-risk conditions should receive one or more doses of vaccine.  Hepatitis A vaccine. Adults who wish to be protected from this disease, have certain high-risk conditions, work with hepatitis A-infected animals, work in hepatitis A research labs, or travel to or work in countries with a high rate of hepatitis A should be immunized. Adults who were previously unvaccinated and who anticipate close contact with an international adoptee during the first 60 days after arrival in the [place] from a country with a high rate of hepatitis A should be immunized.  Hepatitis B vaccine. Adults who wish to be protected from this disease, have certain high-risk conditions, may be exposed to blood or other infectious body fluids, are household contacts or sex partners of hepatitis B positive people, are clients or workers in certain care facilities, or travel to or work in countries with a high rate of hepatitis B should be immunized.  Haemophilus influenzae type b (Hib) vaccine. A previously unvaccinated person with asplenia or sickle cell disease or having a scheduled splenectomy should receive 1 dose of Hib vaccine. Regardless of previous immunization, a recipient of a hematopoietic stem cell transplant should receive a  3-dose series 6-12 months after her successful transplant. Hib vaccine is not recommended for adults with HIV infection. Preventive Services / Frequency Ages 35 to 4 years  Blood pressure check.** / Every 3-5 years.  Lipid and cholesterol check.** / Every 5 years beginning at age 60.  Clinical breast exam.** / Every 3 years for women in their 71s and 10s.  BRCA-related cancer risk assessment.** / For women who have family members with a BRCA-related cancer (breast, ovarian, tubal, or peritoneal cancers).  Pap test.** / Every 2 years from ages 76 through 26. Every 3 years starting at age 61 through age 76 or 93 with a history of 3 consecutive normal Pap tests.  HPV screening.** / Every 3 years from ages 37 through ages 60 to 51 with a history of 3 consecutive normal Pap tests.  Hepatitis C blood test.** / For any individual with known risks for hepatitis C.  Skin self-exam. / Monthly.  Influenza vaccine. / Every year.  Tetanus, diphtheria, and acellular pertussis (Tdap, Td) vaccine.** / Consult your health care provider. Pregnant women should receive 1 dose of Tdap vaccine during each pregnancy. 1 dose of Td every 10 years.  Varicella vaccine.** / Consult your health care provider. Pregnant females who do not have evidence of immunity should receive the first dose after pregnancy.  HPV vaccine. / 3 doses over 6 months, if 93 and younger. The vaccine is not recommended for use in pregnant females. However, pregnancy testing is not needed before receiving a dose.  Measles, mumps, rubella (MMR) vaccine.** / You need at least 1 dose of MMR if you were born in 1957 or later. You may also need a 2nd dose. For females of childbearing age, rubella immunity should be determined. If there is no evidence of immunity, females who are not pregnant should be vaccinated. If there is no evidence of immunity, females who are  pregnant should delay immunization until after pregnancy.  Pneumococcal  13-valent conjugate (PCV13) vaccine.** / Consult your health care provider.  Pneumococcal polysaccharide (PPSV23) vaccine.** / 1 to 2 doses if you smoke cigarettes or if you have certain conditions.  Meningococcal vaccine.** / 1 dose if you are age 68 to 8 years and a Market researcher living in a residence hall, or have one of several medical conditions, you need to get vaccinated against meningococcal disease. You may also need additional booster doses.  Hepatitis A vaccine.** / Consult your health care provider.  Hepatitis B vaccine.** / Consult your health care provider.  Haemophilus influenzae type b (Hib) vaccine.** / Consult your health care provider. Ages 7 to 53 years  Blood pressure check.** / Every year.  Lipid and cholesterol check.** / Every 5 years beginning at age 25 years.  Lung cancer screening. / Every year if you are aged 11-80 years and have a 30-pack-year history of smoking and currently smoke or have quit within the past 15 years. Yearly screening is stopped once you have quit smoking for at least 15 years or develop a health problem that would prevent you from having lung cancer treatment.  Clinical breast exam.** / Every year after age 48 years.  BRCA-related cancer risk assessment.** / For women who have family members with a BRCA-related cancer (breast, ovarian, tubal, or peritoneal cancers).  Mammogram.** / Every year beginning at age 41 years and continuing for as long as you are in good health. Consult with your health care provider.  Pap test.** / Every 3 years starting at age 65 years through age 37 or 70 years with a history of 3 consecutive normal Pap tests.  HPV screening.** / Every 3 years from ages 72 years through ages 60 to 40 years with a history of 3 consecutive normal Pap tests.  Fecal occult blood test (FOBT) of stool. / Every year beginning at age 21 years and continuing until age 5 years. You may not need to do this test if you get  a colonoscopy every 10 years.  Flexible sigmoidoscopy or colonoscopy.** / Every 5 years for a flexible sigmoidoscopy or every 10 years for a colonoscopy beginning at age 35 years and continuing until age 48 years.  Hepatitis C blood test.** / For all people born from [birth year] through 1965 and any individual with known risks for hepatitis C.  Skin self-exam. / Monthly.  Influenza vaccine. / Every year.  Tetanus, diphtheria, and acellular pertussis (Tdap/Td) vaccine.** / Consult your health care provider. Pregnant women should receive 1 dose of Tdap vaccine during each pregnancy. 1 dose of Td every 10 years.  Varicella vaccine.** / Consult your health care provider. Pregnant females who do not have evidence of immunity should receive the first dose after pregnancy.  Zoster vaccine.** / 1 dose for adults aged 30 years or older.  Measles, mumps, rubella (MMR) vaccine.** / You need at least 1 dose of MMR if you were born in 1957 or later. You may also need a second dose. For females of childbearing age, rubella immunity should be determined. If there is no evidence of immunity, females who are not pregnant should be vaccinated. If there is no evidence of immunity, females who are pregnant should delay immunization until after pregnancy.  Pneumococcal 13-valent conjugate (PCV13) vaccine.** / Consult your health care provider.  Pneumococcal polysaccharide (PPSV23) vaccine.** / 1 to 2 doses if you smoke cigarettes or if you have certain conditions.  Meningococcal vaccine.** /  Consult your health care provider.  Hepatitis A vaccine.** / Consult your health care provider.  Hepatitis B vaccine.** / Consult your health care provider.  Haemophilus influenzae type b (Hib) vaccine.** / Consult your health care provider. Ages 64 years and over  Blood pressure check.** / Every year.  Lipid and cholesterol check.** / Every 5 years beginning at age 23 years.  Lung cancer screening. / Every year if you  are aged 16-80 years and have a 30-pack-year history of smoking and currently smoke or have quit within the past 15 years. Yearly screening is stopped once you have quit smoking for at least 15 years or develop a health problem that would prevent you from having lung cancer treatment.  Clinical breast exam.** / Every year after age 74 years.  BRCA-related cancer risk assessment.** / For women who have family members with a BRCA-related cancer (breast, ovarian, tubal, or peritoneal cancers).  Mammogram.** / Every year beginning at age 44 years and continuing for as long as you are in good health. Consult with your health care provider.  Pap test.** / Every 3 years starting at age 58 years through age 22 or 39 years with 3 consecutive normal Pap tests. Testing can be stopped between 65 and 70 years with 3 consecutive normal Pap tests and no abnormal Pap or HPV tests in the past 10 years.  HPV screening.** / Every 3 years from ages 64 years through ages 70 or 61 years with a history of 3 consecutive normal Pap tests. Testing can be stopped between 65 and 70 years with 3 consecutive normal Pap tests and no abnormal Pap or HPV tests in the past 10 years.  Fecal occult blood test (FOBT) of stool. / Every year beginning at age 40 years and continuing until age 27 years. You may not need to do this test if you get a colonoscopy every 10 years.  Flexible sigmoidoscopy or colonoscopy.** / Every 5 years for a flexible sigmoidoscopy or every 10 years for a colonoscopy beginning at age 7 years and continuing until age 32 years.  Hepatitis C blood test.** / For all people born from [birth year] through 1966 and any individual with known risks for hepatitis C.  Osteoporosis screening.** / A one-time screening for women ages 30 years and over and women at risk for fractures or osteoporosis.  Skin self-exam. / Monthly.  Influenza vaccine. / Every year.  Tetanus, diphtheria, and acellular pertussis (Tdap/Td)  vaccine.** / 1 dose of Td every 10 years.  Varicella vaccine.** / Consult your health care provider.  Zoster vaccine.** / 1 dose for adults aged 35 years or older.  Pneumococcal 13-valent conjugate (PCV13) vaccine.** / Consult your health care provider.  Pneumococcal polysaccharide (PPSV23) vaccine.** / 1 dose for all adults aged 46 years and older.  Meningococcal vaccine.** / Consult your health care provider.  Hepatitis A vaccine.** / Consult your health care provider.  Hepatitis B vaccine.** / Consult your health care provider.  Haemophilus influenzae type b (Hib) vaccine.** / Consult your health care provider. ** Family history and personal history of risk and conditions may change your health care provider's recommendations.   This information is not intended to replace advice given to you by your health care provider. Make sure you discuss any questions you have with your health care provider.   Document Released: 03/29/2001 Document Revised: 02/21/2014 Document Reviewed: 06/28/2010 Elsevier Interactive Patient Education [employer].

## 2015-04-09 NOTE — Progress Notes (Signed)
Pre visit review using our clinic review tool, if applicable. No additional management support is needed unless otherwise documented below in the visit note. 

## 2015-04-10 LAB — HEPATITIS C ANTIBODY: HCV AB: NEGATIVE

## 2015-04-10 NOTE — Progress Notes (Signed)
Subjective:     Sherry Ross is a 60 y.o. female and is here for a comprehensive physical exam. The patient reports no problems.  Social History   Social History  . Marital Status: Married    Spouse Name: N/A  . Number of Children: N/A  . Years of Education: N/A   Occupational History  . medical billing    Social History Main Topics  . Smoking status: Former Smoker -- 0.80 packs/day for 4 years    Types: Cigarettes    Quit date: 02/15/1983  . Smokeless tobacco: Never Used  . Alcohol Use: Yes  . Drug Use: No  . Sexual Activity: Not on file   Other Topics Concern  . Not on file   Social History Narrative   Health Maintenance  Topic Date Due  . Hepatitis C Screening  01-29-1956  . HIV Screening  11/15/2015 (Originally 08/16/1969)  . INFLUENZA VACCINE  09/15/2015  . MAMMOGRAM  03/01/2016  . COLONOSCOPY  11/20/2021  . TETANUS/TDAP  01/25/2023  . ZOSTAVAX  Completed    The following portions of the patient's history were reviewed and updated as appropriate:  She  has a past medical history of Hyperlipidemia and Hypertension. She  does not have any pertinent problems on file. She  has past surgical history that includes Cholecystectomy (1999); Abdominal hysterectomy (1995); and orbera. Her family history includes Alcohol abuse in her father; Breast cancer in her mother; Diabetes in her brother; Heart disease in her brother; Hypertension in her brother; Uterine cancer in her mother. She  reports that she quit smoking about 32 years ago. Her smoking use included Cigarettes. She has a 3.2 pack-year smoking history. She has never used smokeless tobacco. She reports that she drinks alcohol. She reports that she does not use illicit drugs. She has a current medication list which includes the following prescription(s): alprazolam. No current outpatient prescriptions on file prior to visit.   No current facility-administered medications on file prior to visit.   She has No Known  Allergies..  Review of Systems Review of Systems  Constitutional: Negative for activity change, appetite change and fatigue.  HENT: Negative for hearing loss, congestion, tinnitus and ear discharge.  dentist q53mEyes: Negative for visual disturbance (see optho q1y -- vision corrected to 20/20 with glasses).  Respiratory: Negative for cough, chest tightness and shortness of breath.   Cardiovascular: Negative for chest pain, palpitations and leg swelling.  Gastrointestinal: Negative for abdominal pain, diarrhea, constipation and abdominal distention.  Genitourinary: Negative for urgency, frequency, decreased urine volume and difficulty urinating.  Musculoskeletal: Negative for back pain, arthralgias and gait problem.  Skin: Negative for color change, pallor and rash.  Neurological: Negative for dizziness, light-headedness, numbness and headaches.  Hematological: Negative for adenopathy. Does not bruise/bleed easily.  Psychiatric/Behavioral: Negative for suicidal ideas, confusion, sleep disturbance, self-injury, dysphoric mood, decreased concentration and agitation.       Objective:    BP 110/74 mmHg  Pulse 74  Temp(Src) 98.4 F (36.9 C) (Oral)  Ht '5\' 5"'$  (1.651 m)  Wt 163 lb 6.4 oz (74.118 kg)  BMI 27.19 kg/m2  SpO2 98%  LMP 02/14/1993 General appearance: alert, cooperative, appears stated age and no distress Head: Normocephalic, without obvious abnormality, atraumatic Eyes: conjunctivae/corneas clear. PERRL, EOM's intact. Fundi benign. Ears: normal TM's and external ear canals both ears Nose: Nares normal. Septum midline. Mucosa normal. No drainage or sinus tenderness. Throat: lips, mucosa, and tongue normal; teeth and gums normal Neck: no adenopathy, no carotid  bruit, no JVD, supple, symmetrical, trachea midline and thyroid not enlarged, symmetric, no tenderness/mass/nodules Back: symmetric, no curvature. ROM normal. No CVA tenderness. Lungs: clear to auscultation  bilaterally Breasts: no nipple d/c, no dimpling, no masses palpated, no axillary nodes Heart: regular rate and rhythm, S1, S2 normal, no murmur, click, rub or gallop Abdomen: soft, non-tender; bowel sounds normal; no masses,  no organomegaly Pelvic: deferred-- s/p TAH Extremities: extremities normal, atraumatic, no cyanosis or edema Pulses: 2+ and symmetric Skin: Skin color, texture, turgor normal. No rashes or lesions Lymph nodes: Cervical, supraclavicular, and axillary nodes normal. Neurologic: Alert and oriented X 3, normal strength and tone. Normal symmetric reflexes. Normal coordination and gait Psych- no depression, no anxiety      Assessment:    Healthy female exam.      Plan:    . See After Visit Summary for Counseling Recommendations   ghm utd Check labs  1. Situational anxiety   - ALPRAZolam (XANAX) 0.25 MG tablet; Take 1 tablet (0.25 mg total) by mouth 3 (three) times daily as needed for anxiety.  Dispense: 60 tablet; Refill: 0  2. Preventative health care   - Comp Met (CMET) - CBC with Differential/Platelet - Lipid panel - POCT urinalysis dipstick - TSH  3. Need for hepatitis C screening test   - Hepatitis C antibody  4. Need for shingles vaccine   - Varicella-zoster vaccine subcutaneous

## 2016-04-08 LAB — HM MAMMOGRAPHY

## 2016-04-29 ENCOUNTER — Encounter: Payer: Self-pay | Admitting: *Deleted

## 2016-12-22 ENCOUNTER — Other Ambulatory Visit: Payer: Self-pay

## 2016-12-22 ENCOUNTER — Ambulatory Visit: Payer: Managed Care, Other (non HMO) | Admitting: Family Medicine

## 2016-12-22 ENCOUNTER — Encounter: Payer: Self-pay | Admitting: Family Medicine

## 2016-12-22 VITALS — BP 128/88 | Temp 98.0°F | Ht 65.6 in | Wt 181.0 lb

## 2016-12-22 DIAGNOSIS — Z23 Encounter for immunization: Secondary | ICD-10-CM | POA: Diagnosis not present

## 2016-12-22 DIAGNOSIS — F418 Other specified anxiety disorders: Secondary | ICD-10-CM

## 2016-12-22 DIAGNOSIS — Z0000 Encounter for general adult medical examination without abnormal findings: Secondary | ICD-10-CM

## 2016-12-22 DIAGNOSIS — K580 Irritable bowel syndrome with diarrhea: Secondary | ICD-10-CM

## 2016-12-22 DIAGNOSIS — R197 Diarrhea, unspecified: Secondary | ICD-10-CM | POA: Diagnosis not present

## 2016-12-22 LAB — CBC WITH DIFFERENTIAL/PLATELET
BASOS ABS: 0 10*3/uL (ref 0.0–0.1)
BASOS PCT: 0.7 % (ref 0.0–3.0)
EOS ABS: 0.2 10*3/uL (ref 0.0–0.7)
Eosinophils Relative: 3.7 % (ref 0.0–5.0)
HEMATOCRIT: 41.5 % (ref 36.0–46.0)
Hemoglobin: 13.7 g/dL (ref 12.0–15.0)
LYMPHS ABS: 2 10*3/uL (ref 0.7–4.0)
Lymphocytes Relative: 33.6 % (ref 12.0–46.0)
MCHC: 33.1 g/dL (ref 30.0–36.0)
MCV: 94.2 fl (ref 78.0–100.0)
MONOS PCT: 9 % (ref 3.0–12.0)
Monocytes Absolute: 0.5 10*3/uL (ref 0.1–1.0)
NEUTROS ABS: 3.2 10*3/uL (ref 1.4–7.7)
NEUTROS PCT: 53 % (ref 43.0–77.0)
PLATELETS: 227 10*3/uL (ref 150.0–400.0)
RBC: 4.41 Mil/uL (ref 3.87–5.11)
RDW: 12.8 % (ref 11.5–15.5)
WBC: 6 10*3/uL (ref 4.0–10.5)

## 2016-12-22 LAB — LIPID PANEL
CHOL/HDL RATIO: 5
Cholesterol: 254 mg/dL — ABNORMAL HIGH (ref 0–200)
HDL: 51 mg/dL (ref >39.00)
LDL Cholesterol: 165 mg/dL — ABNORMAL HIGH (ref 0–99)
NONHDL: 203
Triglycerides: 191 mg/dL — ABNORMAL HIGH (ref 0.0–149.0)
VLDL: 38.2 mg/dL (ref 0.0–40.0)

## 2016-12-22 LAB — COMPREHENSIVE METABOLIC PANEL
ALK PHOS: 50 U/L (ref 39–117)
ALT: 15 U/L (ref 0–35)
AST: 18 U/L (ref 0–37)
Albumin: 4.4 g/dL (ref 3.5–5.2)
BILIRUBIN TOTAL: 0.9 mg/dL (ref 0.2–1.2)
BUN: 18 mg/dL (ref 6–23)
CO2: 28 meq/L (ref 19–32)
CREATININE: 0.6 mg/dL (ref 0.40–1.20)
Calcium: 9.9 mg/dL (ref 8.4–10.5)
Chloride: 106 mEq/L (ref 96–112)
GFR: 107.54 mL/min (ref >60.00)
GLUCOSE: 104 mg/dL — AB (ref 70–99)
Potassium: 4.9 mEq/L (ref 3.5–5.1)
Sodium: 142 mEq/L (ref 135–145)
TOTAL PROTEIN: 7.2 g/dL (ref 6.0–8.3)

## 2016-12-22 LAB — TSH: TSH: 1.42 u[IU]/mL (ref 0.35–4.50)

## 2016-12-22 MED ORDER — ALPRAZOLAM 0.25 MG PO TABS: 0.2500 mg | ORAL_TABLET | Freq: Three times a day (TID) | ORAL | 0 refills | Status: DC | PRN

## 2016-12-22 NOTE — Progress Notes (Signed)
Patient ID: Sherry Ross, female    DOB: 03/04/1955  Age: 61 y.o. MRN: [id number]    Subjective:  Subjective  HPI Sherry Ross presents for c/o diarrhea -- she has a hx IBS but would like to be tested for celiac dz..  Her husband was recently tested and her step daughter has celiac.    Review of Systems  Constitutional: Negative for activity change, appetite change, chills, diaphoresis, fatigue, fever and unexpected weight change.  Eyes: Negative for pain, redness and visual disturbance.  Respiratory: Negative for cough, chest tightness, shortness of breath and wheezing.   Cardiovascular: Negative for chest pain, palpitations and leg swelling.  Gastrointestinal: Negative for abdominal distention and abdominal pain.  Endocrine: Negative for cold intolerance, heat intolerance, polydipsia, polyphagia and polyuria.  Genitourinary: Negative for difficulty urinating, dyspareunia, dysuria, flank pain, frequency, genital sores, hematuria, menstrual problem, pelvic pain, urgency, vaginal discharge and vaginal pain.  Musculoskeletal: Negative for back pain.  Neurological: Negative for dizziness, light-headedness, numbness and headaches.  Psychiatric/Behavioral: Positive for sleep disturbance.    History Past Medical History:  Diagnosis Date  . Hyperlipidemia   . Hypertension     She has a past surgical history that includes Cholecystectomy (1999); Abdominal hysterectomy (1995); and orbera.   Her family history includes Alcohol abuse in her father; Breast cancer in her mother; Diabetes in her brother; Heart disease in her brother; Hypertension in her brother; Uterine cancer in her mother.She reports that she quit smoking about 33 years ago. Her smoking use included cigarettes. She has a 3.20 pack-year smoking history. she has never used smokeless tobacco. She reports that she drinks alcohol. She reports that she does not use drugs.  No current outpatient medications on file prior to visit.    No current facility-administered medications on file prior to visit.      Objective:  Objective  Physical Exam  Constitutional: She is oriented to person, place, and time. She appears well-developed and well-nourished.  HENT:  Head: Normocephalic and atraumatic.  Eyes: Conjunctivae and EOM are normal.  Neck: Normal range of motion. Neck supple. No JVD present. Carotid bruit is not present. No thyromegaly present.  Cardiovascular: Normal rate, regular rhythm and normal heart sounds.  No murmur heard. Pulmonary/Chest: Effort normal and breath sounds normal. No respiratory distress. She has no wheezes. She has no rales. She exhibits no tenderness.  Musculoskeletal: She exhibits no edema.  Neurological: She is alert and oriented to person, place, and time.  Psychiatric: She has a normal mood and affect. Her behavior is normal. Judgment and thought content normal.  Nursing note and vitals reviewed.  BP 128/88   Temp 98 F (36.7 C)   Ht 5' 5.6" (1.666 m)   Wt 181 lb (82.1 kg)   LMP 02/14/1993   SpO2 98%   BMI 29.57 kg/m  Wt Readings from Last 3 Encounters:  12/22/16 181 lb (82.1 kg)  04/09/15 163 lb 6.4 oz (74.1 kg)  10/07/14 167 lb (75.8 kg)     Lab Results  Component Value Date   WBC 6.0 12/22/2016   HGB 13.7 12/22/2016   HCT 41.5 12/22/2016   PLT 227.0 12/22/2016   GLUCOSE 104 (H) 12/22/2016   CHOL 254 (H) 12/22/2016   TRIG 191.0 (H) 12/22/2016   HDL 51.00 12/22/2016   LDLDIRECT 125.0 01/15/2014   LDLCALC 165 (H) 12/22/2016   ALT 15 12/22/2016   AST 18 12/22/2016   NA 142 12/22/2016   K 4.9 12/22/2016   CL 106 12/22/2016  CREATININE 0.60 12/22/2016   BUN 18 12/22/2016   CO2 28 12/22/2016   TSH 1.42 12/22/2016   HGBA1C 5.9 11/23/2012    Ct Abdomen Pelvis W Contrast  Result Date: 09/30/2014 CLINICAL DATA:  Lower abdominal cramping for 3 hours. History of constipation. EXAM: CT ABDOMEN AND PELVIS WITH CONTRAST TECHNIQUE: Multidetector CT imaging of the  abdomen and pelvis was performed using the standard protocol following bolus administration of intravenous contrast. CONTRAST:  70mL OMNIPAQUE IOHEXOL 300 MG/ML SOLN, 171mL OMNIPAQUE IOHEXOL 300 MG/ML SOLN COMPARISON:  None. FINDINGS: Lung bases are clear.  Negative for free air. There is a large fluid-filled device within the distal stomach. Structure measures 8.2 x 10.5 x 10.2 cm and makes contact with anterior and posterior walls of the stomach. This is compatible with an [place] weight loss balloon. Moderate distention of the proximal stomach. There is contrast in the duodenum. There is no significant wall thickening or inflammation around the stomach. The gallbladder has been removed. Normal appearance of the liver. Portal venous system is patent. Normal appearance of the pancreas, spleen, adrenal glands and both kidneys. There is no significant free fluid or lymphadenopathy. The uterus is absent. Evidence for left ovarian tissue. Urinary bladder is decompressed. No acute abnormality to the small bowel, large bowel or appendix. There is disc space loss at L5-S1.  No acute bone abnormality. IMPRESSION: No acute abnormality in the abdomen or pelvis. There is a weight loss balloon positioned in the distal stomach. There is moderate distention of the stomach but no inflammatory changes. Electronically Signed   By: Markus Daft M.D.   On: 09/30/2014 07:41     Assessment & Plan:  Plan  I am having Sherry Ross maintain her ALPRAZolam.  No orders of the defined types were placed in this encounter.   Problem List Items Addressed This Visit      Unprioritized   Diarrhea - Primary    She has a hx of IBS but has not seen GI In a long tiime Labs ordered  Refer to GI      Relevant Orders   TSH (Completed)   Gliadin antibodies, serum   Tissue transglutaminase, IgA (Completed)   Reticulin Antibody, IgA w reflex titer   Preventative health care   Relevant Orders   CBC with Differential/Platelet  (Completed)   Lipid panel (Completed)   Comprehensive metabolic panel (Completed)   TSH (Completed)    Other Visit Diagnoses    Need for immunization against influenza       Relevant Orders   Flu Vaccine QUAD 6+ mos IM (Fluarix) (Completed)   Irritable bowel syndrome with diarrhea       Relevant Orders   Ambulatory referral to Gastroenterology      Follow-up: Return in about 6 months (around 06/21/2017), or if symptoms worsen or fail to improve, for annual exam, fasting.  Ann Held, DO

## 2016-12-22 NOTE — Telephone Encounter (Signed)
rx faxed to [facility]

## 2016-12-22 NOTE — Patient Instructions (Signed)
Gluten-Free Diet for Celiac Disease, Adult The gluten-free diet includes all foods that do not contain gluten. Gluten is a protein that is found in wheat, rye, barley, and some other grains. Following the gluten-free diet is the only treatment for people with celiac disease. It helps to prevent damage to the intestines and improves or eliminates the symptoms of celiac disease. Following the gluten-free diet requires some planning. It can be challenging at first, but it gets easier with time and practice. There are more gluten-free options available today than ever before. If you need help finding gluten-free foods or if you have questions, talk with your diet and nutrition specialist (registered dietitian) or your health care provider. What do I need to know about a gluten-free diet?  All fruits, vegetables, and meats are safe to eat and do not contain gluten.  When grocery shopping, start by shopping in the produce, meat, and dairy sections. These sections are more likely to contain gluten-free foods. Then move to the aisles that contain packaged foods if you need to.  Read all food labels. Gluten is often added to foods. Always check the ingredient list and look for warnings, such as "may contain gluten."  Talk with your dietitian or health care provider before taking a gluten-free multivitamin or mineral supplement.  Be aware of gluten-free foods having contact with foods that contain gluten (cross-contamination). This can happen at home and with any processed foods. ? Talk with your health care provider or dietitian about how to reduce the risk of cross-contamination in your home. ? If you have questions about how a food is processed, ask the manufacturer. What key words help to identify gluten? Foods that list any of these key words on the label usually contain gluten:  Wheat, flour, enriched flour, bromated flour, white flour, durum flour, graham flour, phosphated flour, self-rising flour,  semolina, farina, barley (malt), rye, and oats.  Starch, dextrin, modified food starch, or cereal.  Thickening, fillers, or emulsifiers.  Malt flavoring, malt extract, or malt syrup.  Hydrolyzed vegetable protein.  In the [place] packaged foods that are gluten-free are required to be labeled "GF." These foods should be easy to identify and are safe to eat. In the [place] food companies are also required to list common food allergens, including wheat, on their labels. Recommended foods Grains  Amaranth, bean flours, 100% buckwheat flour, corn, millet, nut flours or nut meals, GF oats, quinoa, rice, sorghum, teff, rice wafers, pure cornmeal tortillas, popcorn, and hot cereals made from cornmeal. Hominy, rice, wild rice. Some Asian rice noodles or bean noodles. Arrowroot starch, corn bran, corn flour, corn germ, cornmeal, corn starch, potato flour, potato starch flour, and rice bran. Plain, brown, and sweet rice flours. Rice polish, soy flour, and tapioca starch. Vegetables  All plain fresh, frozen, and canned vegetables. Fruits  All plain fresh, frozen, canned, and dried fruits, and 100% fruit juices. Meats and other protein foods  All fresh beef, pork, poultry, fish, seafood, and eggs. Fish canned in water, oil, brine, or vegetable broth. Plain nuts and seeds, peanut butter. Some lunch meat and some frankfurters. Dried beans, dried peas, and lentils. Dairy  Fresh plain, dry, evaporated, or condensed milk. Cream, butter, sour cream, whipping cream, and most yogurts. Unprocessed cheese, most processed cheeses, some cottage cheese, some cream cheeses. Beverages  Coffee, tea, most herbal teas. Carbonated beverages and some root beers. Wine, sake, and distilled spirits, such as gin, vodka, and whiskey. Most hard ciders. Fats and oils  Butter,   margarine, vegetable oil, hydrogenated butter, olive oil, shortening, lard, cream, and some mayonnaise. Some commercial salad dressings. Olives. Sweets  and desserts  Sugar, honey, some syrups, molasses, jelly, and jam. Plain hard candy, marshmallows, and gumdrops. Pure cocoa powder. Plain chocolate. Custard and some pudding mixes. Gelatin desserts, sorbets, frozen ice pops, and sherbet. Cake, cookies, and other desserts prepared with allowed flours. Some commercial ice creams. Cornstarch, tapioca, and rice puddings. Seasoning and other foods  Some canned or frozen soups. Monosodium glutamate (MSG). Cider, rice, and wine vinegar. Baking soda and baking powder. Cream of tartar. Baking and nutritional yeast. Certain soy sauces made without wheat (ask your dietitian about specific brands that are allowed). Nuts, coconut, and chocolate. Salt, pepper, herbs, spices, flavoring extracts, imitation or artificial flavorings, natural flavorings, and food colorings. Some medicines and supplements. Some lip glosses and other cosmetics. Rice syrups. The items listed may not be a complete list. Talk with your dietitian about what dietary choices are best for you. Foods to avoid Grains  Barley, bran, bulgur, couscous, cracked wheat, [place], farro, graham, malt, matzo, semolina, wheat germ, and all wheat and rye cereals including spelt and kamut. Cereals containing malt as a flavoring, such as rice cereal. Noodles, spaghetti, macaroni, most packaged rice mixes, and all mixes containing wheat, rye, barley, or triticale. Vegetables  Most creamed vegetables and most vegetables canned in sauces. Some commercially prepared vegetables and salads. Fruits  Thickened or prepared fruits and some pie fillings. Some fruit snacks and fruit roll-ups. Meats and other protein foods  Any meat or meat alternative containing wheat, rye, barley, or gluten stabilizers. These are often marinated or packaged meats and lunch meats. Bread-containing products, such as Swiss steak, croquettes, meatballs, and meatloaf. Most tuna canned in vegetable broth and turkey with hydrolyzed vegetable  protein (HVP) injected as part of the basting. Seitan. Imitation fish. Eggs in sauces made from ingredients to avoid. Dairy  Commercial chocolate milk drinks and malted milk. Some non-dairy creamers. Any cheese product containing ingredients to avoid. Beverages  Certain cereal beverages. Beer, ale, malted milk, and some root beers. Some hard ciders. Some instant flavored coffees. Some herbal teas made with barley or with barley malt added. Fats and oils  Some commercial salad dressings. Sour cream containing modified food starch. Sweets and desserts  Some toffees. Chocolate-coated nuts (may be rolled in wheat flour) and some commercial candies and candy bars. Most cakes, cookies, donuts, pastries, and other baked goods. Some commercial ice cream. Ice cream cones. Commercially prepared mixes for cakes, cookies, and other desserts. Bread pudding and other puddings thickened with flour. Products containing brown rice syrup made with barley malt enzyme. Desserts and sweets made with malt flavoring. Seasoning and other foods  Some curry powders, some dry seasoning mixes, some gravy extracts, some meat sauces, some ketchups, some prepared mustards, and horseradish. Certain soy sauces. Malt vinegar. Bouillon and bouillon cubes that contain HVP. Some chip dips, and some chewing gum. Yeast extract. Brewer's yeast. Caramel color. Some medicines and supplements. Some lip glosses and other cosmetics. The items listed may not be a complete list. Talk with your dietitian about what dietary choices are best for you. Summary  Gluten is a protein that is found in wheat, rye, barley, and some other grains. The gluten-free diet includes all foods that do not contain gluten.  If you need help finding gluten-free foods or if you have questions, talk with your diet and nutrition specialist (registered dietitian) or your health care provider.  Read   all food labels. Gluten is often added to foods. Always check the  ingredient list and look for warnings, such as "may contain gluten." This information is not intended to replace advice given to you by your health care provider. Make sure you discuss any questions you have with your health care provider. Document Released: 01/31/2005 Document Revised: 11/16/2015 Document Reviewed: 11/16/2015 Elsevier Interactive Patient Education  2018 [employer]  

## 2016-12-23 ENCOUNTER — Encounter: Payer: Self-pay | Admitting: Family Medicine

## 2016-12-23 NOTE — Assessment & Plan Note (Signed)
She has a hx of IBS but has not seen GI In a long tiime Labs ordered  Refer to GI

## 2016-12-28 ENCOUNTER — Other Ambulatory Visit: Payer: Self-pay | Admitting: *Deleted

## 2016-12-28 DIAGNOSIS — K580 Irritable bowel syndrome with diarrhea: Secondary | ICD-10-CM

## 2016-12-29 LAB — RETICULIN ANTIBODIES, IGA W TITER: RETICULIN IGA SCREEN: NEGATIVE

## 2016-12-29 LAB — GLIADIN ANTIBODIES, SERUM
Gliadin IgA: 13 Units
Gliadin IgG: 5 Units

## 2016-12-29 LAB — TISSUE TRANSGLUTAMINASE, IGA: (TTG) AB, IGA: 1 U/mL

## 2017-04-21 LAB — HM MAMMOGRAPHY

## 2017-05-25 ENCOUNTER — Encounter: Payer: Self-pay | Admitting: Family Medicine

## 2017-06-20 ENCOUNTER — Encounter: Payer: Self-pay | Admitting: Family Medicine

## 2017-06-20 ENCOUNTER — Ambulatory Visit (INDEPENDENT_AMBULATORY_CARE_PROVIDER_SITE_OTHER): Payer: Managed Care, Other (non HMO) | Admitting: Family Medicine

## 2017-06-20 VITALS — BP 126/80 | HR 78 | Temp 98.3°F | Resp 16 | Ht 66.0 in | Wt 180.4 lb

## 2017-06-20 DIAGNOSIS — Z114 Encounter for screening for human immunodeficiency virus [HIV]: Secondary | ICD-10-CM

## 2017-06-20 DIAGNOSIS — Z0000 Encounter for general adult medical examination without abnormal findings: Secondary | ICD-10-CM | POA: Diagnosis not present

## 2017-06-20 DIAGNOSIS — F419 Anxiety disorder, unspecified: Secondary | ICD-10-CM

## 2017-06-20 DIAGNOSIS — N898 Other specified noninflammatory disorders of vagina: Secondary | ICD-10-CM

## 2017-06-20 DIAGNOSIS — F32A Depression, unspecified: Secondary | ICD-10-CM

## 2017-06-20 DIAGNOSIS — E785 Hyperlipidemia, unspecified: Secondary | ICD-10-CM | POA: Diagnosis not present

## 2017-06-20 DIAGNOSIS — Z79899 Other long term (current) drug therapy: Secondary | ICD-10-CM

## 2017-06-20 DIAGNOSIS — Z780 Asymptomatic menopausal state: Secondary | ICD-10-CM

## 2017-06-20 DIAGNOSIS — F329 Major depressive disorder, single episode, unspecified: Secondary | ICD-10-CM

## 2017-06-20 LAB — COMPREHENSIVE METABOLIC PANEL
ALK PHOS: 57 U/L (ref 39–117)
ALT: 22 U/L (ref 0–35)
AST: 20 U/L (ref 0–37)
Albumin: 4.2 g/dL (ref 3.5–5.2)
BUN: 15 mg/dL (ref 6–23)
CHLORIDE: 103 meq/L (ref 96–112)
CO2: 29 meq/L (ref 19–32)
Calcium: 9.2 mg/dL (ref 8.4–10.5)
Creatinine, Ser: 0.56 mg/dL (ref 0.40–1.20)
GFR: 116.27 mL/min (ref >60.00)
GLUCOSE: 107 mg/dL — AB (ref 70–99)
POTASSIUM: 4 meq/L (ref 3.5–5.1)
SODIUM: 140 meq/L (ref 135–145)
TOTAL PROTEIN: 7 g/dL (ref 6.0–8.3)
Total Bilirubin: 0.8 mg/dL (ref 0.2–1.2)

## 2017-06-20 LAB — LIPID PANEL
CHOL/HDL RATIO: 4
Cholesterol: 222 mg/dL — ABNORMAL HIGH (ref 0–200)
HDL: 53.1 mg/dL (ref >39.00)
NonHDL: 168.57
Triglycerides: 229 mg/dL — ABNORMAL HIGH (ref 0.0–149.0)
VLDL: 45.8 mg/dL — AB (ref 0.0–40.0)

## 2017-06-20 LAB — CBC WITH DIFFERENTIAL/PLATELET
BASOS PCT: 0.7 % (ref 0.0–3.0)
Basophils Absolute: 0 10*3/uL (ref 0.0–0.1)
EOS PCT: 4.2 % (ref 0.0–5.0)
Eosinophils Absolute: 0.2 10*3/uL (ref 0.0–0.7)
HCT: 40.5 % (ref 36.0–46.0)
Hemoglobin: 14.1 g/dL (ref 12.0–15.0)
LYMPHS ABS: 2 10*3/uL (ref 0.7–4.0)
Lymphocytes Relative: 40.2 % (ref 12.0–46.0)
MCHC: 34.8 g/dL (ref 30.0–36.0)
MCV: 89.7 fl (ref 78.0–100.0)
MONO ABS: 0.4 10*3/uL (ref 0.1–1.0)
MONOS PCT: 7.3 % (ref 3.0–12.0)
NEUTROS ABS: 2.4 10*3/uL (ref 1.4–7.7)
NEUTROS PCT: 47.6 % (ref 43.0–77.0)
PLATELETS: 201 10*3/uL (ref 150.0–400.0)
RBC: 4.52 Mil/uL (ref 3.87–5.11)
RDW: 13.4 % (ref 11.5–15.5)
WBC: 5.1 10*3/uL (ref 4.0–10.5)

## 2017-06-20 LAB — LDL CHOLESTEROL, DIRECT: LDL DIRECT: 142 mg/dL

## 2017-06-20 LAB — TSH: TSH: 1.22 u[IU]/mL (ref 0.35–4.50)

## 2017-06-20 NOTE — Progress Notes (Signed)
Subjective:     Sherry Ross is a 62 y.o. female and is here for a comprehensive physical exam. The patient reports problems - seeing ortho for back pain -- will be having surgery in the near future.  Social History   Socioeconomic History  . Marital status: Married    Spouse name: Not on file  . Number of children: Not on file  . Years of education: Not on file  . Highest education level: Not on file  Occupational History  . 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 Financial resource strain: Not on file  . Food insecurity:    Worry: Not on file    Inability: Not on file  . Transportation needs:    Medical: Not on file    Non-medical: Not on file  Tobacco Use  . Smoking status: Former Smoker    Packs/day: 0.80    Years: 4.00    Pack years: 3.20    Types: Cigarettes    Last attempt to quit: 02/15/1983    Years since quitting: 34.3  . Smokeless tobacco: Never Used  Substance and Sexual Activity  . Alcohol use: Yes  . Drug use: No  . Sexual activity: Not on file  Lifestyle  . Physical activity:    Days per week: Not on file    Minutes per session: Not on file  . Stress: Not on file  Relationships  . Social connections:    Talks on phone: Not on file    Gets together: Not on file    Attends religious service: Not on file    Active member of club or organization: Not on file    Attends meetings of clubs or organizations: Not on file    Relationship status: Not on file  . Intimate partner violence:    Fear of current or ex partner: Not on file    Emotionally abused: Not on file    Physically abused: Not on file    Forced sexual activity: Not on file  Other Topics Concern  . Not on file  Social History Narrative  . Not on file   Health Maintenance  Topic Date Due  . HIV Screening  08/16/1969  . INFLUENZA VACCINE  09/14/2017  . MAMMOGRAM  04/22/2018  . COLONOSCOPY  11/20/2021  . TETANUS/TDAP  01/25/2023  . Hepatitis C Screening   Completed    The following portions of the patient's history were reviewed and updated as appropriate:  She  has a past medical history of Hyperlipidemia and Hypertension. She does not have any pertinent problems on file. She  has a past surgical history that includes Cholecystectomy (1999); Abdominal hysterectomy (1995); and orbera. Her family history includes Alcohol abuse in her father; Breast cancer in her mother; Diabetes in her brother; Heart disease in her brother; Hypertension in her brother; Uterine cancer in her mother. She  reports that she quit smoking about 34 years ago. Her smoking use included cigarettes. She has a 3.20 pack-year smoking history. She has never used smokeless tobacco. She reports that she drinks alcohol. She reports that she does not use drugs. She has a current medication list which includes the following prescription(s): gabapentin. Current Outpatient Medications on File Prior to Visit  Medication Sig Dispense Refill  . gabapentin (NEURONTIN) 300 MG capsule Take 1 capsule by mouth 3 (three) times daily.     No current facility-administered medications on file prior to visit.    She has  No Known Allergies..  Review of Systems Review of Systems  Constitutional: Negative for activity change, appetite change and fatigue.  HENT: Negative for hearing loss, congestion, tinnitus and ear discharge.  dentist q41m Eyes: Negative for visual disturbance (see optho q1y -- vision corrected to 20/20 with glasses).  Respiratory: Negative for cough, chest tightness and shortness of breath.   Cardiovascular: Negative for chest pain, palpitations and leg swelling.  Gastrointestinal: Negative for abdominal pain, diarrhea, constipation and abdominal distention.  Genitourinary: Negative for urgency, frequency, decreased urine volume and difficulty urinating.  Musculoskeletal: Negative for back pain, arthralgias and gait problem.  Skin: Negative for color change, pallor and rash.   Neurological: Negative for dizziness, light-headedness, numbness and headaches.  Hematological: Negative for adenopathy. Does not bruise/bleed easily.  Psychiatric/Behavioral: Negative for suicidal ideas, confusion, sleep disturbance, self-injury, dysphoric mood, decreased concentration and agitation.       Objective:    BP 126/80 (BP Location: Right Arm, Cuff Size: Normal)   Pulse 78   Temp 98.3 F (36.8 C) (Oral)   Resp 16   Ht 5\' 6"  (1.676 m)   Wt 180 lb 6.4 oz (81.8 kg)   LMP 02/14/1993   SpO2 98%   BMI 29.12 kg/m  General appearance: alert, cooperative, appears stated age and no distress Head: Normocephalic, without obvious abnormality, atraumatic Eyes: conjunctivae/corneas clear. PERRL, EOM's intact. Fundi benign. Ears: normal TM's and external ear canals both ears Nose: Nares normal. Septum midline. Mucosa normal. No drainage or sinus tenderness. Throat: lips, mucosa, and tongue normal; teeth and gums normal Neck: no adenopathy, no carotid bruit, no JVD, supple, symmetrical, trachea midline and thyroid not enlarged, symmetric, no tenderness/mass/nodules Back: symmetric, no curvature. ROM normal. No CVA tenderness. Lungs: clear to auscultation bilaterally Breasts: normal appearance, no masses or tenderness Heart: regular rate and rhythm, S1, S2 normal, no murmur, click, rub or gallop Abdomen: soft, non-tender; bowel sounds normal; no masses,  no organomegaly Pelvic: not indicated; status post hysterectomy, negative ROS Extremities: extremities normal, atraumatic, no cyanosis or edema Pulses: 2+ and symmetric Skin: Skin color, texture, turgor normal. No rashes or lesions Lymph nodes: Cervical, supraclavicular, and axillary nodes normal. Neurologic: Alert and oriented X 3, normal strength and tone. Normal symmetric reflexes. Normal coordination and gait    Assessment:    Healthy female exam.      Plan:    see labs  ghm utd D/w pt shingrix See After Visit Summary  for Counseling Recommendations    1. Anxiety and depression stable  2. High risk medication use  3. Screening for HIV without presence of risk factors   - HIV antibody  4. Hyperlipidemia LDL goal <100 Encouraged heart healthy diet, increase exercise, avoid trans fats, consider a krill oil cap daily - CBC with Differential/Platelet - Comprehensive metabolic panel - Lipid panel - TSH - HIV antibody  5. Preventative health care See above - CBC with Differential/Platelet - Comprehensive metabolic panel - Lipid panel - TSH - HIV antibody  6. Menopause  - Ambulatory referral to Obstetrics / Gynecology  7. Vaginal dryness  - Ambulatory referral to Obstetrics / Gynecology

## 2017-06-20 NOTE — Patient Instructions (Signed)
Preventive Care 40-64 Years, Female Preventive care refers to lifestyle choices and visits with your health care provider that can promote health and wellness. What does preventive care include?  A yearly physical exam. This is also called an annual well check.  Dental exams once or twice a year.  Routine eye exams. Ask your health care provider how often you should have your eyes checked.  Personal lifestyle choices, including: ? Daily care of your teeth and gums. ? Regular physical activity. ? Eating a healthy diet. ? Avoiding tobacco and drug use. ? Limiting alcohol use. ? Practicing safe sex. ? Taking low-dose aspirin daily starting at age 58. ? Taking vitamin and mineral supplements as recommended by your health care provider. What happens during an annual well check? The services and screenings done by your health care provider during your annual well check will depend on your age, overall health, lifestyle risk factors, and family history of disease. Counseling Your health care provider may ask you questions about your:  Alcohol use.  Tobacco use.  Drug use.  Emotional well-being.  Home and relationship well-being.  Sexual activity.  Eating habits.  Work and work Statistician.  Method of birth control.  Menstrual cycle.  Pregnancy history.  Screening You may have the following tests or measurements:  Height, weight, and BMI.  Blood pressure.  Lipid and cholesterol levels. These may be checked every 5 years, or more frequently if you are over 81 years old.  Skin check.  Lung cancer screening. You may have this screening every year starting at age 78 if you have a 30-pack-year history of smoking and currently smoke or have quit within the past 15 years.  Fecal occult blood test (FOBT) of the stool. You may have this test every year starting at age 65.  Flexible sigmoidoscopy or colonoscopy. You may have a sigmoidoscopy every 5 years or a colonoscopy  every 10 years starting at age 30.  Hepatitis C blood test.  Hepatitis B blood test.  Sexually transmitted disease (STD) testing.  Diabetes screening. This is done by checking your blood sugar (glucose) after you have not eaten for a while (fasting). You may have this done every 1-3 years.  Mammogram. This may be done every 1-2 years. Talk to your health care provider about when you should start having regular mammograms. This may depend on whether you have a family history of breast cancer.  BRCA-related cancer screening. This may be done if you have a family history of breast, ovarian, tubal, or peritoneal cancers.  Pelvic exam and Pap test. This may be done every 3 years starting at age 80. Starting at age 36, this may be done every 5 years if you have a Pap test in combination with an HPV test.  Bone density scan. This is done to screen for osteoporosis. You may have this scan if you are at high risk for osteoporosis.  Discuss your test results, treatment options, and if necessary, the need for more tests with your health care provider. Vaccines Your health care provider may recommend certain vaccines, such as:  Influenza vaccine. This is recommended every year.  Tetanus, diphtheria, and acellular pertussis (Tdap, Td) vaccine. You may need a Td booster every 10 years.  Varicella vaccine. You may need this if you have not been vaccinated.  Zoster vaccine. You may need this after age 5.  Measles, mumps, and rubella (MMR) vaccine. You may need at least one dose of MMR if you were born in  1957 or later. You may also need a second dose.  Pneumococcal 13-valent conjugate (PCV13) vaccine. You may need this if you have certain conditions and were not previously vaccinated.  Pneumococcal polysaccharide (PPSV23) vaccine. You may need one or two doses if you smoke cigarettes or if you have certain conditions.  Meningococcal vaccine. You may need this if you have certain  conditions.  Hepatitis A vaccine. You may need this if you have certain conditions or if you travel or work in places where you may be exposed to hepatitis A.  Hepatitis B vaccine. You may need this if you have certain conditions or if you travel or work in places where you may be exposed to hepatitis B.  Haemophilus influenzae type b (Hib) vaccine. You may need this if you have certain conditions.  Talk to your health care provider about which screenings and vaccines you need and how often you need them. This information is not intended to replace advice given to you by your health care provider. Make sure you discuss any questions you have with your health care provider. Document Released: 02/27/2015 Document Revised: 10/21/2015 Document Reviewed: 12/02/2014 Elsevier Interactive Patient Education  2018 [employer] Inc[employer]  

## 2017-06-21 LAB — HIV ANTIBODY (ROUTINE TESTING W REFLEX): HIV: NONREACTIVE

## 2017-07-07 ENCOUNTER — Encounter: Payer: Self-pay | Admitting: Family Medicine

## 2017-07-07 MED ORDER — SIMVASTATIN 20 MG PO TABS: 20.0000 mg | ORAL_TABLET | Freq: Every day | ORAL | 2 refills | Status: DC

## 2017-07-07 NOTE — Telephone Encounter (Signed)
Pt called to get the medication of Zocor 20mg , contact pt to advise if needed or send in medication into [facility]

## 2017-07-12 ENCOUNTER — Telehealth: Payer: Self-pay | Admitting: Family Medicine

## 2017-07-12 NOTE — Telephone Encounter (Signed)
Copied from [place] [id number]. Topic: Quick Communication - See Telephone Encounter >> Jul 12, 2017 10:26 AM Cleaster Corin, NT wrote: CRM for notification. See Telephone encounter for: 07/12/17.  Pt. Stating that med. Isnt working simvastatin (ZOCOR) 20 MG tablet [[id number] seeing if something else could be called in.pt. States that she is having leg pain wihile taking med.  [id number] - [place], [place] - [address] AT [place] [address] Phone: [phone] Fax: [phone]

## 2017-07-13 NOTE — Telephone Encounter (Signed)
Change to crestor 10 mg --- try every other day #15

## 2017-07-17 ENCOUNTER — Other Ambulatory Visit: Payer: Self-pay

## 2017-07-17 ENCOUNTER — Telehealth: Payer: Self-pay | Admitting: Family Medicine

## 2017-07-17 ENCOUNTER — Encounter: Payer: Managed Care, Other (non HMO) | Admitting: Obstetrics & Gynecology

## 2017-07-17 MED ORDER — ROSUVASTATIN CALCIUM 10 MG PO TABS: 10.0000 mg | ORAL_TABLET | ORAL | 3 refills | Status: DC

## 2017-07-17 NOTE — Telephone Encounter (Signed)
Copied from [place] [id number]. Topic: Quick Communication - See Telephone Encounter >> Jul 12, 2017 10:26 AM Cleaster Corin, NT wrote: CRM for notification. See Telephone encounter for: 07/12/17.  Pt. Stating that med. Isnt working simvastatin (ZOCOR) 20 MG tablet [[id number] seeing if something else could be called in.pt. States that she is having leg pain wihile taking med.  [id number] - [place], [place] - [address] [place] [address] Phone: [phone] Fax: [phone]   >> Jul 17, 2017  2:21 PM Cleaster Corin, NT wrote: pt. Returning call to sheketia (let pt. Know that she was with another pt. At the moment and will give her a callback)

## 2017-07-17 NOTE — Telephone Encounter (Signed)
Left message on machine to call back  

## 2017-07-17 NOTE — Telephone Encounter (Signed)
Patient called through the [facility], triage nurse advised to tell patient about the change in therapy from Zocor to Crestor. New rx sent to her pharmacy. Zocor discontinued.

## 2017-07-18 NOTE — Telephone Encounter (Signed)
See other phone note

## 2017-07-20 ENCOUNTER — Ambulatory Visit: Payer: Managed Care, Other (non HMO) | Admitting: Obstetrics and Gynecology

## 2017-07-20 ENCOUNTER — Encounter: Payer: Self-pay | Admitting: Obstetrics and Gynecology

## 2017-07-20 ENCOUNTER — Encounter: Payer: Managed Care, Other (non HMO) | Admitting: Obstetrics and Gynecology

## 2017-07-20 VITALS — BP 140/85 | HR 65 | Ht 65.5 in | Wt 175.0 lb

## 2017-07-20 DIAGNOSIS — N952 Postmenopausal atrophic vaginitis: Secondary | ICD-10-CM | POA: Diagnosis not present

## 2017-07-20 MED ORDER — OSPEMIFENE 60 MG PO TABS: 1.0000 | ORAL_TABLET | Freq: Every day | ORAL | 5 refills | Status: DC

## 2017-07-20 NOTE — Progress Notes (Signed)
62 yo postmenopausal with BMI 28 who is here for the evaluation of vaginal dryness and dyspareunia. Patient has noted worsening in her symptoms over the past year. She has used water based lubricants without improvement in her dyspareunia. Wiping following urination is at times very irritation. Patient also reports some occasional vulva pruritis. She denies any pelvic pain or abnormal discharge. She is sexually active with her husband. Patient had a hysterectomy for fibroid uterus with preservation of her ovaries. Patient is without any other complaints  Past Medical History:  Diagnosis Date  . Hyperlipidemia   . Hypertension    Past Surgical History:  Procedure Laterality Date  . ABDOMINAL HYSTERECTOMY  1995  . CHOLECYSTECTOMY  1999  . Tonia Brooms     Family History  Problem Relation Age of Onset  . Alcohol abuse Father   . Uterine cancer Mother   . Breast cancer Mother   . Heart disease Brother   . Hypertension Brother   . Diabetes Brother    Social History   Tobacco Use  . Smoking status: Former Smoker    Packs/day: 0.80    Years: 4.00    Pack years: 3.20    Types: Cigarettes    Last attempt to quit: 02/15/1983    Years since quitting: 34.4  . Smokeless tobacco: Never Used  Substance Use Topics  . Alcohol use: Yes  . Drug use: No   ROS See pertinent in HPI  GENERAL: Well-developed, well-nourished female in no acute distress.  ABDOMEN: Soft, nontender, nondistended. No organomegaly. PELVIC: Normal external female genitalia. Vagina is pale pink and atrophic.  Normal discharge. No adnexal mass or tenderness. EXTREMITIES: No cyanosis, clubbing, or edema, 2+ distal pulses.  A/P 62 yo here for the evaluation of atrophic vaginitis - wet prep collected - Discussed medical management with osphena and estrace vaginal cream. Sample for both provided. Patient plans to start osphena for 3 months. Rx was provided She plans to start estrace if no improvement in her symptoms - Patient  will be contacted with results of wet prep - patient with normal mammogram 04/2017 - RTC prn

## 2017-07-20 NOTE — Patient Instructions (Addendum)
Atrophic Vaginitis Atrophic vaginitis is a condition in which the tissues that line the vagina become dry and thin. This condition is most common in women who have stopped having regular menstrual periods (menopause). This usually starts when a woman is 45-63 years old. Estrogen helps to keep the vagina moist. It stimulates the vagina to produce a clear fluid that lubricates the vagina for sexual intercourse. This fluid also protects the vagina from infection. Lack of estrogen can cause the lining of the vagina to get thinner and dryer. The vagina may also shrink in size. It may become less elastic. Atrophic vaginitis tends to get worse over time as a woman's estrogen level drops. What are the causes? This condition is caused by the normal drop in estrogen that happens around the time of menopause. What increases the risk? Certain conditions or situations may lower a woman's estrogen level, which increases her risk of atrophic vaginitis. These include:  Taking medicine that blocks estrogen.  Having ovaries removed surgically.  Being treated for cancer with X-ray treatment (radiation) or medicines (chemotherapy).  Exercising very hard and often.  Having an eating disorder (anorexia).  Giving birth or breastfeeding.  Being over the age of 50.  Smoking.  What are the signs or symptoms? Symptoms of this condition include:  Pain, soreness, or bleeding during sexual intercourse (dyspareunia).  Vaginal burning, irritation, or itching.  Pain or bleeding during a vaginal examination using a speculum (pelvic exam).  Loss of interest in sexual activity.  Having burning pain when passing urine.  Vaginal discharge that is brown or yellow.  In some cases, there are no symptoms. How is this diagnosed? This condition is diagnosed with a medical history and physical exam. This will include a pelvic exam that checks whether the inside of your vagina appears pale, thin, or dry. Rarely, you may  also have other tests, including:  A urine test.  A test that checks the acid balance in your vaginal fluid (acid balance test).  How is this treated? Treatment for this condition may depend on the severity of your symptoms. Treatment may include:  Using an over-the-counter vaginal lubricant before you have sexual intercourse.  Using a long-acting vaginal moisturizer.  Using low-dose vaginal estrogen for moderate to severe symptoms that do not respond to other treatments. Options include creams, tablets, and inserts (vaginal rings). Before using vaginal estrogen, tell your health care provider if you have a history of: ? Breast cancer. ? Endometrial cancer. ? Blood clots.  Taking medicines. You may be able to take a daily pill for dyspareunia. Discuss all of the risks of this medicine with your health care provider. It is usually not recommended for women who have a family history or personal history of breast cancer.  If your symptoms are very mild and you are not sexually active, you may not need treatment. Follow these instructions at home:  Take medicines only as directed by your health care provider. Do not use herbal or alternative medicines unless your health care provider says that you can.  Use over-the-counter creams, lubricants, or moisturizers for dryness only as directed by your health care provider.  If your atrophic vaginitis is caused by menopause, discuss all of your menopausal symptoms and treatment options with your health care provider.  Do not douche.  Do not use products that can make your vagina dry. These include: ? Scented feminine sprays. ? Scented tampons. ? Scented soaps.  If it hurts to have sex, talk with your sexual   partner. Contact a health care provider if:  Your discharge looks different than normal.  Your vagina has an unusual smell.  You have new symptoms.  Your symptoms do not improve with treatment.  Your symptoms get worse. This  information is not intended to replace advice given to you by your health care provider. Make sure you discuss any questions you have with your health care provider. Document Released: 06/17/2014 Document Revised: 07/09/2015 Document Reviewed: 01/22/2014 Elsevier Interactive Patient Education  2018 [place] oral tablets What is this medicine? OSPEMIFENE (os PEM i feen) is used to treat painful sexual intercourse in females after menopause, a symptom of menopause that occurs due to changes in and around the vagina. This medicine may be used for other purposes; ask your health care provider or pharmacist if you have questions. COMMON BRAND NAME(S): Osphena What should I tell my health care provider before I take this medicine? They need to know if you have any of these conditions: -cancer, such as breast, uterine, or other cancer -heart disease -history of blood clots -history of stroke -history of vaginal bleeding -liver disease -premenopausal -smoke tobacco -an unusual or allergic reaction to ospemifene, other medicines, foods, dyes, or preservatives -pregnant or trying to get pregnant -breast-feeding How should I use this medicine? Take this medicine by mouth with a glass of water. Take this medicine with food. Follow the directions on the prescription label. Do not take your medicine more often than directed. Talk to your pediatrician regarding the use of this medicine in children. Special care may be needed. Overdosage: If you think you have taken too much of this medicine contact a poison control center or emergency room at once. NOTE: This medicine is only for you. Do not share this medicine with others. What if I miss a dose? If you miss a dose, take it as soon as you can. If it is almost time for your next dose, take only that dose. Do not take double or extra doses. What may interact with this  medicine? -doxycycline -estrogens -fluconazole -furosemide -glyburide -ketoconazole -phenytoin -rifampin -warfarin This list may not describe all possible interactions. Give your health care provider a list of all the medicines, herbs, non-prescription drugs, or dietary supplements you use. Also tell them if you smoke, drink alcohol, or use illegal drugs. Some items may interact with your medicine. What should I watch for while using this medicine? Visit your health care professional for regular checks on your progress. You will need a regular breast and pelvic exam and Pap smear while on this medicine. You should also discuss the need for regular mammograms with your health care professional, and follow his or her guidelines for these tests. Also, periodically discuss the need to continue taking this medicine. Taking this medicine for long periods of time may increase your risk for serious side effects. This medicine can increase the risk of developing a condition (endometrial hyperplasia) that may lead to cancer of the lining of the uterus. Taking progestins, another hormone drug, with this medicine lowers the risk of developing this condition. Therefore, if your uterus has not been removed (by a hysterectomy), your doctor may prescribe a progestin for you to take together with your estrogen. You should know, however, that taking estrogens with progestins may have additional health risks. You should discuss the use of estrogens and progestins with your health care professional to determine the benefits and risks for you. This medicine can rarely cause blood clots. You should  avoid long periods of bed rest while taking this medicine. If you are going to have surgery, tell your doctor or health care professional that you are taking this medicine. This medicine should be stopped at least 4-6 weeks before surgery. After surgery, it should be restarted only after you are walking again. It should not be  restarted while you still need long periods of bed rest. You should not smoke while taking this medicine. Smoking may also increase your risk of blood clots. Smoking can also decrease the effects of this medicine. This medicine does not prevent hot flashes. It may cause hot flashes in some patients. If you have any reason to think you are pregnant; stop taking this medicine at once and contact your doctor or health care professional. What side effects may I notice from receiving this medicine? Side effects that you should report to your doctor or health care professional as soon as possible: -breathing problems -changes in vision -confusion, trouble speaking or understanding -new breast lumps -pain, swelling, warmth in the leg -pelvic pain or pressure -severe headaches -sudden chest pain -sudden numbness or weakness of the face, arm or leg -trouble walking, dizziness, loss of balance or coordination -unusual vaginal bleeding patterns -vaginal discharge that is bloody or brown Side effects that usually do not require medical attention (report to your doctor or health care professional if they continue or are bothersome): -hot flushes or flashes -increased sweating -muscle cramps -vaginal discharge (white or clear) This list may not describe all possible side effects. Call your doctor for medical advice about side effects. You may report side effects to FDA at 1-800-FDA-1088. Where should I keep my medicine? Keep out of the reach of children. Store at room temperature between 20 and 25 degrees C (68 and 77 degrees F). Protect from light. Keep container tightly closed. Throw away any unused medicine after the expiration date. NOTE: This sheet is a summary. It may not cover all possible information. If you have questions about this medicine, talk to your doctor, pharmacist, or health care provider.  2018 Elsevier/Gold Standard (2015-03-05 10:08:00)  Estradiol vaginal cream What is this  medicine? ESTRADIOL (es tra DYE ole) contains the female hormone estrogen. It is used for symptoms of menopause, like vaginal dryness and irritation. This medicine may be used for other purposes; ask your health care provider or pharmacist if you have questions. COMMON BRAND NAME(S): Estrace What should I tell my health care provider before I take this medicine? They need to know if you have any of these conditions: -abnormal vaginal bleeding -blood vessel disease or blood clots -breast, cervical, endometrial, ovarian, liver, or uterine cancer -dementia -diabetes -gallbladder disease -heart disease or recent heart attack -high blood pressure -high cholesterol -high levels of calcium in the blood -hysterectomy -kidney disease -liver disease -migraine headaches -protein C deficiency -protein S deficiency -stroke -systemic lupus erythematosus (SLE) -tobacco smoker -an unusual or allergic reaction to estrogens, other hormones, soy, other medicines, foods, dyes, or preservatives -pregnant or trying to get pregnant -breast-feeding How should I use this medicine? This medicine is for use in the vagina only. Do not take by mouth. Follow the directions on the prescription label. Read package directions carefully before using. Use the special applicator supplied with the cream. Wash hands before and after use. Fill the applicator with the prescribed amount of cream. Lie on your back, part and bend your knees. Insert the applicator into the vagina and push the plunger to expel the cream into the  vagina. Wash the applicator with warm soapy water and rinse well. Use exactly as directed for the complete length of time prescribed. Do not stop using except on the advice of your doctor or health care professional. A patient package insert for the product will be given with each prescription and refill. Read this sheet carefully each time. The sheet may change frequently. Talk to your pediatrician  regarding the use of this medicine in children. This medicine is not approved for use in children. Overdosage: If you think you have taken too much of this medicine contact a poison control center or emergency room at once. NOTE: This medicine is only for you. Do not share this medicine with others. What if I miss a dose? If you miss a dose, use it as soon as you can. If it is almost time for your next dose, use only that dose. Do not use double or extra doses. What may interact with this medicine? Do not take this medicine with any of the following medications: -aromatase inhibitors like aminoglutethimide, anastrozole, exemestane, letrozole, testolactone This medicine may also interact with the following medications: -barbiturates used for inducing sleep or treating seizures -carbamazepine -grapefruit juice -medicines for fungal infections like ketoconazole and itraconazole -raloxifene -rifabutin -rifampin -rifapentine -ritonavir -some antibiotics used to treat infections -St. John's Wort -tamoxifen -warfarin This list may not describe all possible interactions. Give your health care provider a list of all the medicines, herbs, non-prescription drugs, or dietary supplements you use. Also tell them if you smoke, drink alcohol, or use illegal drugs. Some items may interact with your medicine. What should I watch for while using this medicine? Visit your health care professional for regular checks on your progress. You will need a regular breast and pelvic exam. You should also discuss the need for regular mammograms with your health care professional, and follow his or her guidelines. This medicine can make your body retain fluid, making your fingers, hands, or ankles swell. Your blood pressure can go up. Contact your doctor or health care professional if you feel you are retaining fluid. If you have any reason to think you are pregnant, stop taking this medicine at once and contact your  doctor or health care professional. Tobacco smoking increases the risk of getting a blood clot or having a stroke, especially if you are more than 63 years old. You are strongly advised not to smoke. If you wear contact lenses and notice visual changes, or if the lenses begin to feel uncomfortable, consult your eye care specialist. If you are going to have elective surgery, you may need to stop taking this medicine beforehand. Consult your health care professional for advice prior to scheduling the surgery. What side effects may I notice from receiving this medicine? Side effects that you should report to your doctor or health care professional as soon as possible: -allergic reactions like skin rash, itching or hives, swelling of the face, lips, or tongue -breast tissue changes or discharge -changes in vision -chest pain -confusion, trouble speaking or understanding -dark urine -general ill feeling or flu-like symptoms -light-colored stools -nausea, vomiting -pain, swelling, warmth in the leg -right upper belly pain -severe headaches -shortness of breath -sudden numbness or weakness of the face, arm or leg -trouble walking, dizziness, loss of balance or coordination -unusual vaginal bleeding -yellowing of the eyes or skin Side effects that usually do not require medical attention (report to your doctor or health care professional if they continue or are bothersome): -hair loss -  increased hunger or thirst -increased urination -symptoms of vaginal infection like itching, irritation or unusual discharge -unusually weak or tired This list may not describe all possible side effects. Call your doctor for medical advice about side effects. You may report side effects to FDA at 1-800-FDA-1088. Where should I keep my medicine? Keep out of the reach of children. Store at room temperature between 15 and 30 degrees C (59 and 86 degrees F). Protect from temperatures above 40 degrees C (104 degrees  C). Do not freeze. Throw away any unused medicine after the expiration date. NOTE: This sheet is a summary. It may not cover all possible information. If you have questions about this medicine, talk to your doctor, pharmacist, or health care provider.  2018 Elsevier/Gold Standard (2010-05-05 09:18:12)

## 2017-07-21 LAB — CERVICOVAGINAL ANCILLARY ONLY
Bacterial vaginitis: NEGATIVE
Candida vaginitis: NEGATIVE

## 2017-10-29 ENCOUNTER — Other Ambulatory Visit: Payer: Self-pay | Admitting: Family Medicine

## 2017-11-01 ENCOUNTER — Encounter: Payer: Self-pay | Admitting: Family Medicine

## 2017-11-01 DIAGNOSIS — M25539 Pain in unspecified wrist: Secondary | ICD-10-CM

## 2017-11-01 DIAGNOSIS — E785 Hyperlipidemia, unspecified: Secondary | ICD-10-CM

## 2017-11-06 ENCOUNTER — Other Ambulatory Visit (INDEPENDENT_AMBULATORY_CARE_PROVIDER_SITE_OTHER): Payer: Managed Care, Other (non HMO)

## 2017-11-06 DIAGNOSIS — E785 Hyperlipidemia, unspecified: Secondary | ICD-10-CM

## 2017-11-06 LAB — COMPREHENSIVE METABOLIC PANEL
ALBUMIN: 4.2 g/dL (ref 3.5–5.2)
ALK PHOS: 56 U/L (ref 39–117)
ALT: 17 U/L (ref 0–35)
AST: 17 U/L (ref 0–37)
BUN: 15 mg/dL (ref 6–23)
CO2: 25 mEq/L (ref 19–32)
CREATININE: 0.53 mg/dL (ref 0.40–1.20)
Calcium: 9.3 mg/dL (ref 8.4–10.5)
Chloride: 104 mEq/L (ref 96–112)
GFR: 123.74 mL/min (ref >60.00)
Glucose, Bld: 96 mg/dL (ref 70–99)
Potassium: 3.8 mEq/L (ref 3.5–5.1)
SODIUM: 139 meq/L (ref 135–145)
TOTAL PROTEIN: 6.7 g/dL (ref 6.0–8.3)
Total Bilirubin: 0.8 mg/dL (ref 0.2–1.2)

## 2017-11-06 LAB — LIPID PANEL
CHOLESTEROL: 169 mg/dL (ref 0–200)
HDL: 51.8 mg/dL (ref >39.00)
LDL CALC: 79 mg/dL (ref 0–99)
NonHDL: 116.99
TRIGLYCERIDES: 190 mg/dL — AB (ref 0.0–149.0)
Total CHOL/HDL Ratio: 3
VLDL: 38 mg/dL (ref 0.0–40.0)

## 2017-11-08 ENCOUNTER — Encounter: Payer: Self-pay | Admitting: Family Medicine

## 2017-11-08 NOTE — Addendum Note (Signed)
Addended byDamita Dunnings D on: 11/08/2017 10:54 AM   Modules accepted: Orders

## 2017-12-15 ENCOUNTER — Telehealth: Payer: Self-pay | Admitting: Family Medicine

## 2017-12-15 NOTE — Telephone Encounter (Signed)
Copied from [place] [id number]. Topic: Quick Communication - See Telephone Encounter >> Dec 15, 2017 10:22 AM Rosalin Hawking wrote: CRM for notification. See Telephone encounter for: 12/15/17.   Pt dropped off document for provider to have on pt's chart (Medical records and info about 12 pages) Documents not needed to be filled out. Document put at front office tray under providers name.

## 2017-12-21 ENCOUNTER — Encounter: Payer: Self-pay | Admitting: Family Medicine

## 2017-12-21 ENCOUNTER — Ambulatory Visit: Payer: Managed Care, Other (non HMO) | Admitting: Family Medicine

## 2017-12-21 VITALS — BP 130/80 | HR 73 | Temp 98.3°F | Resp 16 | Ht 66.0 in | Wt 171.0 lb

## 2017-12-21 DIAGNOSIS — E2839 Other primary ovarian failure: Secondary | ICD-10-CM | POA: Diagnosis not present

## 2017-12-21 NOTE — Assessment & Plan Note (Signed)
Recheck bone density here Take calcium and vita d Weight bearing exercise

## 2017-12-21 NOTE — Progress Notes (Signed)
Patient ID: Sherry Ross, female    DOB: 11-09-1955  Age: 62 y.o. MRN: [id number]    Subjective:  Subjective  HPI Sherry Ross presents for f/u [facility] screen which showed she has osteoporosis.  She has never had an abnormal bmd in past.   No hx fracture.  Review of Systems  Constitutional: Negative for chills and fever.  HENT: Negative for congestion and hearing loss.   Eyes: Negative for discharge.  Respiratory: Negative for cough and shortness of breath.   Cardiovascular: Negative for chest pain, palpitations and leg swelling.  Gastrointestinal: Negative for abdominal pain, blood in stool, constipation, diarrhea, nausea and vomiting.  Genitourinary: Negative for dysuria, frequency, hematuria and urgency.  Musculoskeletal: Negative for back pain and myalgias.  Skin: Negative for rash.  Allergic/Immunologic: Negative for environmental allergies.  Neurological: Negative for dizziness, weakness and headaches.  Hematological: Does not bruise/bleed easily.  Psychiatric/Behavioral: Negative for suicidal ideas. The patient is not nervous/anxious.     History Past Medical History:  Diagnosis Date  . Hyperlipidemia   . Hypertension     She has a past surgical history that includes Cholecystectomy (1999); Abdominal hysterectomy (1995); and orbera.   Her family history includes Alcohol abuse in her father; Breast cancer in her mother; Diabetes in her brother; Heart disease in her brother; Hypertension in her brother; Uterine cancer in her mother.She reports that she quit smoking about 34 years ago. Her smoking use included cigarettes. She has a 3.20 pack-year smoking history. She has never used smokeless tobacco. She reports that she drinks alcohol. She reports that she does not use drugs.  Current Outpatient Medications on File Prior to Visit  Medication Sig Dispense Refill  . co-enzyme Q-10 30 MG capsule Take 30 mg by mouth daily.    Marland Kitchen gabapentin (NEURONTIN) 300 MG capsule Take 1  capsule by mouth every other day.     . Omega-3 Fatty Acids (FISH OIL) 1000 MG CAPS Take by mouth daily.    . rosuvastatin (CRESTOR) 10 MG tablet TAKE 1 TABLET (10 MG TOTAL) BY MOUTH EVERY OTHER DAY. 15 tablet 3  . TURMERIC PO Take by mouth daily.     No current facility-administered medications on file prior to visit.      Objective:  Objective  Physical Exam  Constitutional: She is oriented to person, place, and time. She appears well-developed and well-nourished.  HENT:  Head: Normocephalic and atraumatic.  Eyes: Conjunctivae and EOM are normal.  Neck: Normal range of motion. Neck supple. No JVD present. Carotid bruit is not present. No thyromegaly present.  Cardiovascular: Normal rate, regular rhythm and normal heart sounds.  No murmur heard. Pulmonary/Chest: Effort normal and breath sounds normal. No respiratory distress. She has no wheezes. She has no rales. She exhibits no tenderness.  Musculoskeletal: She exhibits no edema.  Neurological: She is alert and oriented to person, place, and time.  Psychiatric: She has a normal mood and affect.  Nursing note and vitals reviewed.  BP 130/80 (BP Location: Left Arm, Patient Position: Sitting, Cuff Size: Large)   Pulse 73   Temp 98.3 F (36.8 C) (Oral)   Resp 16   Ht 5\' 6"  (1.676 m) Comment: pt. reported  Wt 171 lb (77.6 kg)   LMP 02/14/1993   SpO2 97%   BMI 27.60 kg/m  Wt Readings from Last 3 Encounters:  12/21/17 171 lb (77.6 kg)  07/20/17 175 lb (79.4 kg)  06/20/17 180 lb 6.4 oz (81.8 kg)     Lab Results  Component Value Date   WBC 5.1 06/20/2017   HGB 14.1 06/20/2017   HCT 40.5 06/20/2017   PLT 201.0 06/20/2017   GLUCOSE 96 11/06/2017   CHOL 169 11/06/2017   TRIG 190.0 (H) 11/06/2017   HDL 51.80 11/06/2017   LDLDIRECT 142.0 06/20/2017   LDLCALC 79 11/06/2017   ALT 17 11/06/2017   AST 17 11/06/2017   NA 139 11/06/2017   K 3.8 11/06/2017   CL 104 11/06/2017   CREATININE 0.53 11/06/2017   BUN 15 11/06/2017     CO2 25 11/06/2017   TSH 1.22 06/20/2017   HGBA1C 5.9 11/23/2012    Ct Abdomen Pelvis W Contrast  Result Date: 09/30/2014 CLINICAL DATA:  Lower abdominal cramping for 3 hours. History of constipation. EXAM: CT ABDOMEN AND PELVIS WITH CONTRAST TECHNIQUE: Multidetector CT imaging of the abdomen and pelvis was performed using the standard protocol following bolus administration of intravenous contrast. CONTRAST:  51mL OMNIPAQUE IOHEXOL 300 MG/ML SOLN, 135mL OMNIPAQUE IOHEXOL 300 MG/ML SOLN COMPARISON:  None. FINDINGS: Lung bases are clear.  Negative for free air. There is a large fluid-filled device within the distal stomach. Structure measures 8.2 x 10.5 x 10.2 cm and makes contact with anterior and posterior walls of the stomach. This is compatible with an [place] weight loss balloon. Moderate distention of the proximal stomach. There is contrast in the duodenum. There is no significant wall thickening or inflammation around the stomach. The gallbladder has been removed. Normal appearance of the liver. Portal venous system is patent. Normal appearance of the pancreas, spleen, adrenal glands and both kidneys. There is no significant free fluid or lymphadenopathy. The uterus is absent. Evidence for left ovarian tissue. Urinary bladder is decompressed. No acute abnormality to the small bowel, large bowel or appendix. There is disc space loss at L5-S1.  No acute bone abnormality. IMPRESSION: No acute abnormality in the abdomen or pelvis. There is a weight loss balloon positioned in the distal stomach. There is moderate distention of the stomach but no inflammatory changes. Electronically Signed   By: Markus Daft M.D.   On: 09/30/2014 07:41     Assessment & Plan:  Plan  I have discontinued Camiah Humm "[name]"'s Ospemifene. I am also having her maintain her gabapentin, rosuvastatin, co-enzyme Q-10, Fish Oil, and TURMERIC PO.  No orders of the defined types were placed in this  encounter.   Problem List Items Addressed This Visit      Unprioritized   Estrogen deficiency - Primary    Recheck bone density here Take calcium and vita d Weight bearing exercise       Relevant Orders   DG Bone Density      Follow-up: Return if symptoms worsen or fail to improve.  Ann Held, DO

## 2017-12-21 NOTE — Patient Instructions (Signed)
Osteoporosis Osteoporosis is the thinning and loss of density in the bones. Osteoporosis makes the bones more brittle, fragile, and likely to break (fracture). Over time, osteoporosis can cause the bones to become so weak that they fracture after a simple fall. The bones most likely to fracture are the bones in the hip, wrist, and spine. What are the causes? The exact cause is not known. What increases the risk? Anyone can develop osteoporosis. You may be at greater risk if you have a family history of the condition or have poor nutrition. You may also have a higher risk if you are:  Female.  50 years old or older.  A smoker.  Not physically active.  White or Asian.  Slender.  What are the signs or symptoms? A fracture might be the first sign of the disease, especially if it results from a fall or injury that would not usually cause a bone to break. Other signs and symptoms include:  Low back and neck pain.  Stooped posture.  Height loss.  How is this diagnosed? To make a diagnosis, your health care provider may:  Take a medical history.  Perform a physical exam.  Order tests, such as: ? A bone mineral density test. ? A dual-energy X-ray absorptiometry test.  How is this treated? The goal of osteoporosis treatment is to strengthen your bones to reduce your risk of a fracture. Treatment may involve:  Making lifestyle changes, such as: ? Eating a diet rich in calcium. ? Doing weight-bearing and muscle-strengthening exercises. ? Stopping tobacco use. ? Limiting alcohol intake.  Taking medicine to slow the process of bone loss or to increase bone density.  Monitoring your levels of calcium and vitamin D.  Follow these instructions at home:  Include calcium and vitamin D in your diet. Calcium is important for bone health, and vitamin D helps the body absorb calcium.  Perform weight-bearing and muscle-strengthening exercises as directed by your health care  provider.  Do not use any tobacco products, including cigarettes, chewing tobacco, and electronic cigarettes. If you need help quitting, ask your health care provider.  Limit your alcohol intake.  Take medicines only as directed by your health care provider.  Keep all follow-up visits as directed by your health care provider. This is important.  Take precautions at home to lower your risk of falling, such as: ? Keeping rooms well lit and clutter free. ? Installing safety rails on stairs. ? Using rubber mats in the bathroom and other areas that are often wet or slippery. Get help right away if: You fall or injure yourself. This information is not intended to replace advice given to you by your health care provider. Make sure you discuss any questions you have with your health care provider. Document Released: 11/10/2004 Document Revised: 07/06/2015 Document Reviewed: 07/11/2013 Elsevier Interactive Patient Education  2018 Elsevier Inc.  

## 2017-12-22 ENCOUNTER — Ambulatory Visit (HOSPITAL_BASED_OUTPATIENT_CLINIC_OR_DEPARTMENT_OTHER)
Admission: RE | Admit: 2017-12-22 | Discharge: 2017-12-22 | Disposition: A | Discharge status: Home / Self Care | Payer: Managed Care, Other (non HMO) | Source: Ambulatory Visit | Attending: Family Medicine | Admitting: Family Medicine

## 2017-12-22 DIAGNOSIS — E2839 Other primary ovarian failure: Secondary | ICD-10-CM | POA: Diagnosis not present

## 2017-12-22 NOTE — Telephone Encounter (Signed)
Forwarded to provider/SLS 11/07

## 2017-12-26 ENCOUNTER — Other Ambulatory Visit: Payer: Self-pay | Admitting: Family Medicine

## 2017-12-26 ENCOUNTER — Encounter: Payer: Self-pay | Admitting: Family Medicine

## 2017-12-26 ENCOUNTER — Telehealth: Payer: Self-pay | Admitting: *Deleted

## 2017-12-26 ENCOUNTER — Telehealth: Payer: Self-pay | Admitting: Family Medicine

## 2017-12-26 DIAGNOSIS — I779 Disorder of arteries and arterioles, unspecified: Secondary | ICD-10-CM

## 2017-12-26 DIAGNOSIS — I739 Peripheral vascular disease, unspecified: Principal | ICD-10-CM

## 2017-12-26 NOTE — Telephone Encounter (Signed)
Dr. Lowne spoke with patient about results. 

## 2017-12-26 NOTE — Telephone Encounter (Signed)
Patient brought in health screening form for carotid US from life screening.  Dr. Etter Sjogren want patient to know that we are setting up an [place] of carotid artery thru [place].

## 2017-12-26 NOTE — Telephone Encounter (Signed)
Please change the order , thanks

## 2017-12-26 NOTE — Telephone Encounter (Signed)
Copied from [id number] [id number]. Topic: General - Other >> Dec 26, 2017 12:55 PM Leward Quan A wrote: Reason for CRM:   Patient called had questions as to why Dr Etter Sjogren Cheri Rous is trying to have her scheduled for another Carotid Artery Screening stated that she just had one through the Life Line screening program and it was fine. Stated that she brought the paperwork in to Dr Carollee Herter personally. Please advise Ph#  [phone]

## 2017-12-27 ENCOUNTER — Encounter (HOSPITAL_BASED_OUTPATIENT_CLINIC_OR_DEPARTMENT_OTHER): Payer: Self-pay

## 2017-12-27 ENCOUNTER — Ambulatory Visit (HOSPITAL_BASED_OUTPATIENT_CLINIC_OR_DEPARTMENT_OTHER): Payer: Managed Care, Other (non HMO)

## 2017-12-27 NOTE — Telephone Encounter (Signed)
Dr. Etter Sjogren spoke with patient about this yesterday.

## 2018-01-22 ENCOUNTER — Encounter: Payer: Self-pay | Admitting: Family Medicine

## 2018-01-22 DIAGNOSIS — I779 Disorder of arteries and arterioles, unspecified: Secondary | ICD-10-CM

## 2018-01-22 DIAGNOSIS — I739 Peripheral vascular disease, unspecified: Principal | ICD-10-CM

## 2018-01-27 ENCOUNTER — Encounter: Payer: Self-pay | Admitting: Family Medicine

## 2018-02-01 ENCOUNTER — Telehealth: Payer: Self-pay | Admitting: *Deleted

## 2018-02-01 NOTE — Telephone Encounter (Signed)
Pt had a appt on 11/13 she called and cancelled it on the 12th due to money

## 2018-02-01 NOTE — Telephone Encounter (Signed)
Can you check on the vascular study for the carotid artery?  It looks like it was put in on 12/29/17.

## 2018-02-02 NOTE — Addendum Note (Signed)
Addended by: Kem Boroughs D on: 02/02/2018 04:53 PM   Modules accepted: Orders

## 2018-02-02 NOTE — Telephone Encounter (Signed)
Spoke with patient on the phone and order placed.

## 2018-02-15 ENCOUNTER — Encounter: Payer: Self-pay | Admitting: Family Medicine

## 2018-02-16 ENCOUNTER — Ambulatory Visit (HOSPITAL_BASED_OUTPATIENT_CLINIC_OR_DEPARTMENT_OTHER): Payer: Managed Care, Other (non HMO)

## 2018-02-19 ENCOUNTER — Other Ambulatory Visit: Payer: Self-pay | Admitting: Family Medicine

## 2018-02-19 DIAGNOSIS — I739 Peripheral vascular disease, unspecified: Principal | ICD-10-CM

## 2018-02-19 DIAGNOSIS — I779 Disorder of arteries and arterioles, unspecified: Secondary | ICD-10-CM

## 2018-02-19 NOTE — Telephone Encounter (Signed)
I have put the referral in ---

## 2018-02-19 NOTE — Telephone Encounter (Signed)
Are you ok with referral to cardiology?  If so what diagnosis code you would like to use?  This is in regards to the test we were trying to do because of the abnormal life line test.

## 2018-02-22 ENCOUNTER — Other Ambulatory Visit: Payer: Self-pay | Admitting: Family Medicine

## 2018-03-22 ENCOUNTER — Encounter: Payer: Self-pay | Admitting: Family Medicine

## 2018-03-31 ENCOUNTER — Encounter: Payer: Self-pay | Admitting: Family Medicine

## 2018-04-25 ENCOUNTER — Other Ambulatory Visit: Payer: Self-pay

## 2018-04-25 ENCOUNTER — Encounter: Payer: Self-pay | Admitting: Cardiology

## 2018-04-25 ENCOUNTER — Ambulatory Visit: Payer: Managed Care, Other (non HMO) | Admitting: Cardiology

## 2018-04-25 VITALS — BP 130/70 | HR 75 | Ht 66.0 in | Wt 176.0 lb

## 2018-04-25 DIAGNOSIS — E669 Obesity, unspecified: Secondary | ICD-10-CM | POA: Diagnosis not present

## 2018-04-25 DIAGNOSIS — I739 Peripheral vascular disease, unspecified: Secondary | ICD-10-CM | POA: Diagnosis not present

## 2018-04-25 DIAGNOSIS — E785 Hyperlipidemia, unspecified: Secondary | ICD-10-CM | POA: Diagnosis not present

## 2018-04-25 DIAGNOSIS — R0609 Other forms of dyspnea: Secondary | ICD-10-CM | POA: Insufficient documentation

## 2018-04-25 NOTE — Patient Instructions (Signed)
Medication Instructions:  Your physician recommends that you continue on your current medications as directed. Please refer to the Current Medication list given to you today.  If you need a refill on your cardiac medications before your next appointment, please call your pharmacy.   Lab work: None If you have labs (blood work) drawn today and your tests are completely normal, you will receive your results only by: Marland Kitchen MyChart Message (if you have MyChart) OR . A paper copy in the mail If you have any lab test that is abnormal or we need to change your treatment, we will call you to review the results.  Testing/Procedures Your physician has requested that you have a carotid duplex. This test is an ultrasound of the carotid arteries in your neck. It looks at blood flow through these arteries that supply the brain with blood. Allow one hour for this exam. There are no restrictions or special instructions.    Follow-Up: At [facility], you and your health needs are our priority.  As part of our continuing mission to provide you with exceptional heart care, we have created designated Provider Care Teams.  These Care Teams include your primary Cardiologist (physician) and Advanced Practice Providers (APPs -  Physician Assistants and Nurse Practitioners) who all work together to provide you with the care you need, when you need it. You will need a follow up appointment in 3 months.  Please call our office 2 months in advance to schedule this appointment.  You may see Dr. Agustin Cree or another member of our [place] Provider Team in [place]: Shirlee More, MD . Jyl Heinz, MD  Any Other Special Instructions Will Be Listed Below (If Applicable).

## 2018-04-25 NOTE — Progress Notes (Signed)
Cardiology Consultation:    Date:  04/25/2018   ID:  Sunni Richardson, DOB 28-Mar-1955, MRN [id number]  PCP:  Ann Held, DO  Cardiologist:  Jenne Campus, MD   Referring MD: Carollee Herter, Alferd Apa, *   Chief Complaint  Patient presents with  . Coronary Artery Disease  I will make sure everything is okay  History of Present Illness:    Linde Wilensky is a 63 y.o. female who is being seen today for the evaluation of abnormal carotic ultrasound at the request of Ann Held, *.  She is a 63 years old woman who is very concerned about her health she gets life screening every few years which involve doing EKG so far no evidence of atrial fibrillation abdominal aortic screen which showed no evidence of aneurysm carotic ultrasounds with showed some intimal thickening on the recent study.  She is very worried about this and that is why she requested to be seen.  Overall she is doing well she goes to gym 3 times a week, she does aerobic exercise for half an hour also lift some weights.  She does not smoke quit smoking 1988 she modified her diet trying to lose some weight.  She drinks about 3 drinks every single day which is concerning.  She thinks that that may be a problem she does have family history of alcoholism.  She denies having a chest pain tightness squeezing pressure been chest no swelling of lower to mid chest.  She does have difficulty sleeping because of her husband snoring a lot.  Recently she got biopsy of the skin done on her leg and she was told to have melanoma surgery scheduled to have more extensive excision of the lesion.  Past Medical History:  Diagnosis Date  . Hyperlipidemia   . Hypertension     Past Surgical History:  Procedure Laterality Date  . ABDOMINAL HYSTERECTOMY  1995  . CHOLECYSTECTOMY  1999  . Tonia Brooms      Current Medications: Current Meds  Medication Sig  . Coenzyme Q10 100 MG capsule Take 100 mg by mouth daily.   Marland Kitchen gabapentin  (NEURONTIN) 300 MG capsule Take 1 capsule by mouth every other day.   . meloxicam (MOBIC) 15 MG tablet Take 15 mg by mouth every other day.  . Omega-3 Fatty Acids (FISH OIL PO) Take 360 mg by mouth daily.   . Probiotic Product (PROBIOTIC-10 PO) Take 1 capsule by mouth every other day.  . rosuvastatin (CRESTOR) 10 MG tablet TAKE 1 TABLET BY MOUTH EVERY OTHER DAY  . TURMERIC PO Take 500 mg by mouth daily.      Allergies:   Patient has no known allergies.   Social History   Socioeconomic History  . Marital status: Married    Spouse name: Not on file  . Number of children: Not on file  . Years of education: Not on file  . Highest education level: Not on file  Occupational History  . 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 Financial resource strain: Not on file  . Food insecurity:    Worry: Not on file    Inability: Not on file  . Transportation needs:    Medical: Not on file    Non-medical: Not on file  Tobacco Use  . Smoking status: Former Smoker    Packs/day: 0.80    Years: 4.00    Pack years: 3.20    Types: Cigarettes  Last attempt to quit: 02/15/1983    Years since quitting: 35.2  . Smokeless tobacco: Never Used  Substance and Sexual Activity  . Alcohol use: Yes  . Drug use: No  . Sexual activity: Not Currently  Lifestyle  . Physical activity:    Days per week: Not on file    Minutes per session: Not on file  . Stress: Not on file  Relationships  . Social connections:    Talks on phone: Not on file    Gets together: Not on file    Attends religious service: Not on file    Active member of club or organization: Not on file    Attends meetings of clubs or organizations: Not on file    Relationship status: Not on file  Other Topics Concern  . Not on file  Social History Narrative  . Not on file     Family History: The patient's family history includes Alcohol abuse in her father; Breast cancer in her mother; Diabetes in her brother;  Heart disease in her brother; Hypertension in her brother; Uterine cancer in her mother. ROS:   Please see the history of present illness.    All 14 point review of systems negative except as described per history of present illness.  EKGs/Labs/Other Studies Reviewed:    The following studies were reviewed today:   EKG:  EKG is  ordered today.  The ekg ordered today demonstrates normal sinus rhythm low voltage nonspecific ST segment changes normal QS complex duration morphology  Recent Labs: 06/20/2017: Hemoglobin 14.1; Platelets 201.0; TSH 1.22 11/06/2017: ALT 17; BUN 15; Creatinine, Ser 0.53; Potassium 3.8; Sodium 139  Recent Lipid Panel    Component Value Date/Time   CHOL 169 11/06/2017 0831   TRIG 190.0 (H) 11/06/2017 0831   HDL 51.80 11/06/2017 0831   CHOLHDL 3 11/06/2017 0831   VLDL 38.0 11/06/2017 0831   LDLCALC 79 11/06/2017 0831   LDLDIRECT 142.0 06/20/2017 0949    Physical Exam:    VS:  BP 130/70   Pulse 75   Ht 5\' 6"  (1.676 m)   Wt 176 lb (79.8 kg)   LMP 02/14/1993   SpO2 98%   BMI 28.41 kg/m     Wt Readings from Last 3 Encounters:  04/25/18 176 lb (79.8 kg)  12/21/17 171 lb (77.6 kg)  07/20/17 175 lb (79.4 kg)     GEN:  Well nourished, well developed in no acute distress HEENT: Normal NECK: No JVD; No carotid bruits LYMPHATICS: No lymphadenopathy CARDIAC: RRR, no murmurs, no rubs, no gallops RESPIRATORY:  Clear to auscultation without rales, wheezing or rhonchi  ABDOMEN: Soft, non-tender, non-distended MUSCULOSKELETAL:  No edema; No deformity  SKIN: Warm and dry NEUROLOGIC:  Alert and oriented x 3 PSYCHIATRIC:  Normal affect   ASSESSMENT:    1. Obesity (BMI 30-39.9)   2. Peripheral vascular disease ([place])   3. Dyslipidemia   4. Dyspnea on exertion    PLAN:    In order of problems listed above:  1. Peripheral vascular disease noted on screening for her carotic arterial disease.  We will schedule her to have carotid ultrasound.  We will send  her to office in [place] because if she will have it done here she will have to pay hospital fee.  I do not anticipate to have significant narrowing however it is essential to really realize how extensive the problem is. 2. Dyslipidemia managed with Crestor her cholesterol is excellent continue that management 3. Is on  exertion but only with extreme exertion we will continue present management. 4. Cardiac risk assessment with risk factors modifications were discussed in details we talked about exercises and recommended to 150 minutes every week of moderate intensity exercise and even increase this.  I recommended to be more careful with alcohol.  She should drink no more than 2 drinks a day.  We talked about diet we discussed basic of DASH diet as well as Mediterranean diet which will be beneficial for situational care.  I will see her back in my office in about 3 months to see how she does.  Carotic ultrasound will be done   Medication Adjustments/Labs and Tests Ordered: Current medicines are reviewed at length with the patient today.  Concerns regarding medicines are outlined above.  No orders of the defined types were placed in this encounter.  No orders of the defined types were placed in this encounter.   Signed, Park Liter, MD, [facility] 04/25/2018 3:04 PM    [facility]

## 2018-04-27 ENCOUNTER — Telehealth: Payer: Self-pay | Admitting: *Deleted

## 2018-04-27 NOTE — Telephone Encounter (Signed)
Received Mammogram results from [place]; forwarded to provider/SLS 03/13

## 2018-05-15 ENCOUNTER — Telehealth: Payer: Self-pay | Admitting: *Deleted

## 2018-05-15 NOTE — Telephone Encounter (Signed)
Cancelled carotid, pt knows

## 2018-05-18 ENCOUNTER — Telehealth: Payer: Self-pay | Admitting: *Deleted

## 2018-05-18 NOTE — Telephone Encounter (Signed)
Received Dermatopathology Report results from [facility]; forwarded to provider/SLS 04/03

## 2018-05-29 NOTE — Telephone Encounter (Signed)
Recall for patient's carotid has been placed

## 2018-07-04 ENCOUNTER — Other Ambulatory Visit: Payer: Self-pay | Admitting: Family Medicine

## 2018-07-05 ENCOUNTER — Encounter: Payer: Self-pay | Admitting: Family Medicine

## 2018-07-06 ENCOUNTER — Ambulatory Visit (INDEPENDENT_AMBULATORY_CARE_PROVIDER_SITE_OTHER): Payer: Managed Care, Other (non HMO) | Admitting: Family Medicine

## 2018-07-06 ENCOUNTER — Other Ambulatory Visit: Payer: Self-pay

## 2018-07-06 ENCOUNTER — Encounter: Payer: Self-pay | Admitting: Family Medicine

## 2018-07-06 DIAGNOSIS — R739 Hyperglycemia, unspecified: Secondary | ICD-10-CM

## 2018-07-06 DIAGNOSIS — E785 Hyperlipidemia, unspecified: Secondary | ICD-10-CM

## 2018-07-06 DIAGNOSIS — F320 Major depressive disorder, single episode, mild: Secondary | ICD-10-CM

## 2018-07-06 MED ORDER — SERTRALINE HCL 50 MG PO TABS: 50.0000 mg | ORAL_TABLET | Freq: Every day | ORAL | 3 refills | Status: DC

## 2018-07-06 NOTE — Progress Notes (Signed)
Virtual Visit via Video Note  I connected with Sherry Ross on 07/06/18 at 11:00 AM EDT by a video enabled telemedicine application and verified that I am speaking with the correct person using two identifiers.  Location: Patient: home Provider: office    I discussed the limitations of evaluation and management by telemedicine and the availability of in person appointments. The patient expressed understanding and agreed to proceed.  History of Present Illness: Pt is home c/o depression and marital problems.  She thinks her husband is bipolar but he has never been diagnosed.  He is willing to go for counseling -- they need a new counselor.    Past Medical History:  Diagnosis Date  . Hyperlipidemia   . Hypertension    Current Outpatient Medications on File Prior to Visit  Medication Sig Dispense Refill  . Coenzyme Q10 100 MG capsule Take 100 mg by mouth daily.     Marland Kitchen gabapentin (NEURONTIN) 300 MG capsule Take 1 capsule by mouth every other day.     . meloxicam (MOBIC) 15 MG tablet Take 15 mg by mouth every other day.    . Omega-3 Fatty Acids (FISH OIL PO) Take 360 mg by mouth daily.     . Probiotic Product (PROBIOTIC-10 PO) Take 1 capsule by mouth every other day.    . rosuvastatin (CRESTOR) 10 MG tablet TAKE 1 TABLET BY MOUTH EVERY OTHER DAY 15 tablet 3  . TURMERIC PO Take 500 mg by mouth daily.      No current facility-administered medications on file prior to visit.      Observations/Objective: No vitals obtained  Pt in NAD  Assessment and Plan: 1. Depression, major, single episode, mild ([place]) Start meds rec counseling  rto 1 month or sooner prn  - sertraline (ZOLOFT) 50 MG tablet; Take 1 tablet (50 mg total) by mouth daily.  Dispense: 30 tablet; Refill: 3  2. Hyperlipidemia, unspecified hyperlipidemia type Check labs  - Lipid panel; Future - Comprehensive metabolic panel; Future  3. Hyperglycemia Check labs  - Hemoglobin A1c; Future   Follow Up Instructions:    I discussed the assessment and treatment plan with the patient. The patient was provided an opportunity to ask questions and all were answered. The patient agreed with the plan and demonstrated an understanding of the instructions.   The patient was advised to call back or seek an in-person evaluation if the symptoms worsen or if the condition fails to improve as anticipated.  I provided 25 minutes of non-face-to-face time during this encounter.   Ann Held, DO

## 2018-07-18 ENCOUNTER — Other Ambulatory Visit: Payer: Self-pay

## 2018-07-18 ENCOUNTER — Other Ambulatory Visit (INDEPENDENT_AMBULATORY_CARE_PROVIDER_SITE_OTHER): Payer: Managed Care, Other (non HMO)

## 2018-07-18 DIAGNOSIS — R739 Hyperglycemia, unspecified: Secondary | ICD-10-CM

## 2018-07-18 DIAGNOSIS — E785 Hyperlipidemia, unspecified: Secondary | ICD-10-CM | POA: Diagnosis not present

## 2018-07-18 LAB — COMPREHENSIVE METABOLIC PANEL
ALT: 19 U/L (ref 0–35)
AST: 20 U/L (ref 0–37)
Albumin: 4.4 g/dL (ref 3.5–5.2)
Alkaline Phosphatase: 55 U/L (ref 39–117)
BUN: 24 mg/dL — ABNORMAL HIGH (ref 6–23)
CO2: 26 mEq/L (ref 19–32)
Calcium: 9.3 mg/dL (ref 8.4–10.5)
Chloride: 105 mEq/L (ref 96–112)
Creatinine, Ser: 0.6 mg/dL (ref 0.40–1.20)
GFR: 100.67 mL/min (ref >60.00)
Glucose, Bld: 107 mg/dL — ABNORMAL HIGH (ref 70–99)
Potassium: 4.2 mEq/L (ref 3.5–5.1)
Sodium: 141 mEq/L (ref 135–145)
Total Bilirubin: 0.6 mg/dL (ref 0.2–1.2)
Total Protein: 6.8 g/dL (ref 6.0–8.3)

## 2018-07-18 LAB — LIPID PANEL
Cholesterol: 197 mg/dL (ref 0–200)
HDL: 62.6 mg/dL (ref >39.00)
LDL Cholesterol: 117 mg/dL — ABNORMAL HIGH (ref 0–99)
NonHDL: 134.59
Total CHOL/HDL Ratio: 3
Triglycerides: 88 mg/dL (ref 0.0–149.0)
VLDL: 17.6 mg/dL (ref 0.0–40.0)

## 2018-07-18 LAB — HEMOGLOBIN A1C: Hgb A1c MFr Bld: 5.9 % (ref 4.6–6.5)

## 2018-07-24 ENCOUNTER — Encounter: Payer: Self-pay | Admitting: Family Medicine

## 2018-07-25 ENCOUNTER — Other Ambulatory Visit: Payer: Self-pay | Admitting: Family Medicine

## 2018-07-25 DIAGNOSIS — R739 Hyperglycemia, unspecified: Secondary | ICD-10-CM

## 2018-07-25 DIAGNOSIS — E785 Hyperlipidemia, unspecified: Secondary | ICD-10-CM

## 2018-07-28 ENCOUNTER — Other Ambulatory Visit: Payer: Self-pay | Admitting: Family Medicine

## 2018-07-28 DIAGNOSIS — F320 Major depressive disorder, single episode, mild: Secondary | ICD-10-CM

## 2018-08-02 ENCOUNTER — Ambulatory Visit (INDEPENDENT_AMBULATORY_CARE_PROVIDER_SITE_OTHER): Payer: Managed Care, Other (non HMO)

## 2018-08-02 ENCOUNTER — Other Ambulatory Visit: Payer: Self-pay

## 2018-08-02 DIAGNOSIS — E785 Hyperlipidemia, unspecified: Secondary | ICD-10-CM | POA: Diagnosis not present

## 2018-08-02 NOTE — Progress Notes (Signed)
Carotid artery duplex completed. Refer to "CV Proc" under chart review to view preliminary results.  08/02/2018 9:35 AM Maudry Mayhew, MHA, RVT, RDCS, RDMS

## 2018-08-06 ENCOUNTER — Telehealth: Payer: Self-pay | Admitting: Emergency Medicine

## 2018-08-06 NOTE — Telephone Encounter (Signed)
Left message for patient to return call regarding results  

## 2018-08-08 ENCOUNTER — Telehealth: Payer: Managed Care, Other (non HMO) | Admitting: Cardiology

## 2018-09-07 ENCOUNTER — Encounter: Payer: Self-pay | Admitting: Family Medicine

## 2018-09-07 LAB — HM MAMMOGRAPHY

## 2018-09-26 ENCOUNTER — Encounter: Payer: Self-pay | Admitting: Family Medicine

## 2018-09-26 NOTE — Telephone Encounter (Signed)
It is prescription --- she would need ov  She may want to check with her ins to make sure they cover it

## 2018-10-03 ENCOUNTER — Telehealth: Payer: Self-pay

## 2018-10-03 NOTE — Telephone Encounter (Signed)
Received PA form for Saxenda- however Pt's BMI is not high enough to be considered for therapy- must be greater than 30. Last BMI 28.42.

## 2018-10-28 ENCOUNTER — Other Ambulatory Visit: Payer: Self-pay | Admitting: Family Medicine

## 2018-11-30 ENCOUNTER — Encounter: Payer: Self-pay | Admitting: Family Medicine

## 2019-01-29 ENCOUNTER — Other Ambulatory Visit: Payer: Self-pay | Admitting: Family Medicine

## 2019-02-01 ENCOUNTER — Telehealth: Payer: Self-pay

## 2019-02-01 NOTE — Telephone Encounter (Signed)
Copied from [place] #[id number]. Topic: General - Inquiry >> Feb 01, 2019 11:38 AM Reyne Dumas L wrote: Reason for CRM:  Pt states she has an appointment with a [facility] scheduled and they called over to get the results of her celiac test.  Pt states that the office told GI that pt never had this test done.  Pt states this test was ordered and done by Dr. Carollee Herter and she wants to know why it isn't showing in our records and can't be given to GI.

## 2019-02-04 ENCOUNTER — Encounter: Payer: Self-pay | Admitting: Family Medicine

## 2019-02-04 ENCOUNTER — Ambulatory Visit (INDEPENDENT_AMBULATORY_CARE_PROVIDER_SITE_OTHER): Payer: Managed Care, Other (non HMO) | Admitting: Family Medicine

## 2019-02-04 ENCOUNTER — Other Ambulatory Visit: Payer: Self-pay

## 2019-02-04 ENCOUNTER — Ambulatory Visit: Payer: Managed Care, Other (non HMO) | Admitting: Family Medicine

## 2019-02-04 VITALS — Temp 97.1°F | Ht 66.0 in

## 2019-02-04 DIAGNOSIS — E785 Hyperlipidemia, unspecified: Secondary | ICD-10-CM | POA: Diagnosis not present

## 2019-02-04 DIAGNOSIS — F419 Anxiety disorder, unspecified: Secondary | ICD-10-CM

## 2019-02-04 DIAGNOSIS — F329 Major depressive disorder, single episode, unspecified: Secondary | ICD-10-CM

## 2019-02-04 MED ORDER — ROSUVASTATIN CALCIUM 10 MG PO TABS: 10.0000 mg | ORAL_TABLET | ORAL | 1 refills | Status: DC

## 2019-02-04 NOTE — Assessment & Plan Note (Signed)
Tolerating statin, encouraged heart healthy diet, avoid trans fats, minimize simple carbs and saturated fats. Increase exercise as tolerated 

## 2019-02-04 NOTE — Assessment & Plan Note (Signed)
Improved  Pt is on no meds  F/u prn

## 2019-02-04 NOTE — Progress Notes (Signed)
Virtual Visit via Video Note  I connected with Sherry Ross on 02/04/19 at  1:40 PM EST by a video enabled telemedicine application and verified that I am speaking with the correct person using two identifiers.  Location: Patient: home  Provider: office   I discussed the limitations of evaluation and management by telemedicine and the availability of in person appointments. The patient expressed understanding and agreed to proceed.  History of Present Illness: Pt is home alone and is seeing GI for her ibs symptoms  Her anxiety and depression is much better and her relationship with her husband is better.  Pt has no complaints    Observations/Objective: Vitals:   02/04/19 1334  Temp: (!) 97.1 F (36.2 C)   Pt is in NAD  Assessment and Plan: 1. Hyperlipidemia, unspecified hyperlipidemia type Tolerating statin, encouraged heart healthy diet, avoid trans fats, minimize simple carbs and saturated fats. Increase exercise as tolerated - rosuvastatin (CRESTOR) 10 MG tablet; Take 1 tablet (10 mg total) by mouth every other day.  Dispense: 45 tablet; Refill: 1 - Lipid panel; Future - Comprehensive metabolic panel; Future  2. Anxiety and depression Improved On no meds   3. Dyslipidemia    Follow Up Instructions:    I discussed the assessment and treatment plan with the patient. The patient was provided an opportunity to ask questions and all were answered. The patient agreed with the plan and demonstrated an understanding of the instructions.   The patient was advised to call back or seek an in-person evaluation if the symptoms worsen or if the condition fails to improve as anticipated.  I provided 15 minutes of non-face-to-face time during this encounter.   Ann Held, DO

## 2019-02-04 NOTE — Telephone Encounter (Signed)
Pt states she printed out the labs herself and took them to GI

## 2019-02-19 ENCOUNTER — Encounter: Payer: Self-pay | Admitting: Family Medicine

## 2019-02-20 NOTE — Telephone Encounter (Signed)
Pt has lab appointment on 02/22/2019

## 2019-02-22 ENCOUNTER — Other Ambulatory Visit: Payer: Self-pay

## 2019-02-22 ENCOUNTER — Other Ambulatory Visit (INDEPENDENT_AMBULATORY_CARE_PROVIDER_SITE_OTHER): Payer: 59

## 2019-02-22 DIAGNOSIS — E785 Hyperlipidemia, unspecified: Secondary | ICD-10-CM

## 2019-02-22 LAB — COMPREHENSIVE METABOLIC PANEL
ALT: 20 U/L (ref 0–35)
AST: 21 U/L (ref 0–37)
Albumin: 4.5 g/dL (ref 3.5–5.2)
Alkaline Phosphatase: 62 U/L (ref 39–117)
BUN: 20 mg/dL (ref 6–23)
CO2: 27 mEq/L (ref 19–32)
Calcium: 9.4 mg/dL (ref 8.4–10.5)
Chloride: 105 mEq/L (ref 96–112)
Creatinine, Ser: 0.58 mg/dL (ref 0.40–1.20)
GFR: 104.49 mL/min (ref >60.00)
Glucose, Bld: 98 mg/dL (ref 70–99)
Potassium: 4.4 mEq/L (ref 3.5–5.1)
Sodium: 139 mEq/L (ref 135–145)
Total Bilirubin: 0.7 mg/dL (ref 0.2–1.2)
Total Protein: 6.7 g/dL (ref 6.0–8.3)

## 2019-02-22 LAB — LIPID PANEL
Cholesterol: 187 mg/dL (ref 0–200)
HDL: 52.5 mg/dL (ref >39.00)
LDL Cholesterol: 106 mg/dL — ABNORMAL HIGH (ref 0–99)
NonHDL: 134.11
Total CHOL/HDL Ratio: 4
Triglycerides: 143 mg/dL (ref 0.0–149.0)
VLDL: 28.6 mg/dL (ref 0.0–40.0)

## 2019-02-28 ENCOUNTER — Other Ambulatory Visit: Payer: Self-pay | Admitting: Family Medicine

## 2019-02-28 DIAGNOSIS — E785 Hyperlipidemia, unspecified: Secondary | ICD-10-CM

## 2019-05-14 LAB — HM MAMMOGRAPHY

## 2019-08-20 DIAGNOSIS — M545 Low back pain: Secondary | ICD-10-CM | POA: Diagnosis not present

## 2019-08-20 DIAGNOSIS — M5416 Radiculopathy, lumbar region: Secondary | ICD-10-CM | POA: Diagnosis not present

## 2019-08-20 DIAGNOSIS — M5136 Other intervertebral disc degeneration, lumbar region: Secondary | ICD-10-CM | POA: Diagnosis not present

## 2019-08-23 DIAGNOSIS — M25562 Pain in left knee: Secondary | ICD-10-CM | POA: Diagnosis not present

## 2019-08-23 DIAGNOSIS — M5416 Radiculopathy, lumbar region: Secondary | ICD-10-CM | POA: Diagnosis not present

## 2019-08-23 DIAGNOSIS — M545 Low back pain: Secondary | ICD-10-CM | POA: Diagnosis not present

## 2019-08-23 DIAGNOSIS — M5136 Other intervertebral disc degeneration, lumbar region: Secondary | ICD-10-CM | POA: Diagnosis not present

## 2019-08-26 DIAGNOSIS — X32XXXS Exposure to sunlight, sequela: Secondary | ICD-10-CM | POA: Diagnosis not present

## 2019-08-26 DIAGNOSIS — L821 Other seborrheic keratosis: Secondary | ICD-10-CM | POA: Diagnosis not present

## 2019-08-26 DIAGNOSIS — D1801 Hemangioma of skin and subcutaneous tissue: Secondary | ICD-10-CM | POA: Diagnosis not present

## 2019-08-26 DIAGNOSIS — L718 Other rosacea: Secondary | ICD-10-CM | POA: Diagnosis not present

## 2019-08-26 DIAGNOSIS — Z85820 Personal history of malignant melanoma of skin: Secondary | ICD-10-CM | POA: Diagnosis not present

## 2019-08-26 DIAGNOSIS — L570 Actinic keratosis: Secondary | ICD-10-CM | POA: Diagnosis not present

## 2019-08-26 DIAGNOSIS — L218 Other seborrheic dermatitis: Secondary | ICD-10-CM | POA: Diagnosis not present

## 2019-08-26 DIAGNOSIS — L814 Other melanin hyperpigmentation: Secondary | ICD-10-CM | POA: Diagnosis not present

## 2019-08-29 DIAGNOSIS — Z86010 Personal history of colonic polyps: Secondary | ICD-10-CM | POA: Diagnosis not present

## 2019-08-29 DIAGNOSIS — D125 Benign neoplasm of sigmoid colon: Secondary | ICD-10-CM | POA: Diagnosis not present

## 2019-08-29 DIAGNOSIS — D122 Benign neoplasm of ascending colon: Secondary | ICD-10-CM | POA: Diagnosis not present

## 2019-08-29 DIAGNOSIS — D124 Benign neoplasm of descending colon: Secondary | ICD-10-CM | POA: Diagnosis not present

## 2019-08-29 DIAGNOSIS — K648 Other hemorrhoids: Secondary | ICD-10-CM | POA: Diagnosis not present

## 2019-08-29 DIAGNOSIS — D126 Benign neoplasm of colon, unspecified: Secondary | ICD-10-CM | POA: Diagnosis not present

## 2019-08-29 DIAGNOSIS — K529 Noninfective gastroenteritis and colitis, unspecified: Secondary | ICD-10-CM | POA: Diagnosis not present

## 2019-08-29 DIAGNOSIS — K635 Polyp of colon: Secondary | ICD-10-CM | POA: Diagnosis not present

## 2019-09-09 ENCOUNTER — Other Ambulatory Visit: Payer: Self-pay

## 2019-09-09 ENCOUNTER — Encounter: Payer: Self-pay | Admitting: Physical Therapy

## 2019-09-09 ENCOUNTER — Ambulatory Visit: Payer: PPO | Attending: Specialist | Admitting: Physical Therapy

## 2019-09-09 DIAGNOSIS — M5441 Lumbago with sciatica, right side: Secondary | ICD-10-CM | POA: Diagnosis not present

## 2019-09-09 DIAGNOSIS — G8929 Other chronic pain: Secondary | ICD-10-CM | POA: Diagnosis not present

## 2019-09-09 DIAGNOSIS — R29898 Other symptoms and signs involving the musculoskeletal system: Secondary | ICD-10-CM | POA: Diagnosis not present

## 2019-09-09 NOTE — Therapy (Addendum)
[facility] [address]  [address] [place], [place], [address] Phone: [phone]   Fax:  [phone]  Physical Therapy Evaluation  Patient Details  Name: Sherry Ross MRN: [id number] Date of Birth: 08-29-1955 Referring Provider (PT): Jene Every, MD   Progress Note Reporting Period 09/09/19 to 09/09/19  See note below for Objective Data and Assessment of Progress/Goals.     Encounter Date: 09/09/2019   PT End of Session - 09/09/19 1059    Visit Number 1    Number of Visits 1    Date for PT Re-Evaluation 10/07/19    Authorization Type [employer]    PT Start Time 1014    PT Stop Time 1047    PT Time Calculation (min) 33 min    Activity Tolerance Patient tolerated treatment well    Behavior During Therapy WFL for tasks assessed/performed           Past Medical History:  Diagnosis Date  . Hyperlipidemia   . Hypertension     Past Surgical History:  Procedure Laterality Date  . ABDOMINAL HYSTERECTOMY  1995  . CHOLECYSTECTOMY  1999  . [place]      There were no vitals filed for this visit.    Subjective Assessment - 09/09/19 1016    Subjective Patient reports R sided LBP with radiation down the R LE to the calf since 2018. Reports N/T in the anterior thigh and down the lateral leg. Worse when "over-doing it" at the gym such as doing the elliptical for an hour or when doing heavy lifting. Feels better with flexion. MD has advised her to drop down to 30 minutes on the elliptical as well as other modifications to her gym workout which has helped. Notes that her MD would like her to do PT to ensure she isn't going to do anything to make her pain worse. Has been speaking with her providers about a possible decompression surgery but would like to avoid this for the time being.    Pertinent History HTN, HLD    Limitations Sitting;Lifting;Standing;Walking;House hold activities    How long can you sit comfortably?  unlimited    How long can you stand comfortably? unlimited    How long can you walk comfortably? unlimited    Diagnostic tests 08/20/19 lumbar xray:  Mild degenerative disc changes at L5-S1. Mild facet arthropathy below L3. Mild degenerative changes of bilateral SI joints.; EMG/NCS on 06/13/17 revealed mild chronic L5/S1 radiculopathy    Patient Stated Goals "make sure I'm not making anything worse"    Currently in Pain? Yes    Pain Score 0-No pain    Pain Location Leg    Pain Orientation Right    Pain Descriptors / Indicators Dull    Pain Type Chronic pain    Pain Radiating Towards down to calf              [facility] PT Assessment - 09/09/19 1024      Assessment   Medical Diagnosis Radiculopathy, lumbar region    Referring Provider (PT) Jene Every, MD    Onset Date/Surgical Date --   since 2018   Next MD Visit not scheduled    Prior Therapy no      Precautions   Precautions None      Balance Screen   Has the patient fallen in the past 6 months No    Has the patient had a decrease in activity level because of a fear  of falling?  No    Is the patient reluctant to leave their home because of a fear of falling?  No      Home Ecologist residence    Living Arrangements Spouse/significant other    Available Help at Discharge Family    Type of [place] to enter    Entrance Stairs-Number of Steps 1    Entrance Stairs-Rails Right    [place] One level      Prior Function   Level of [place] Retired    Leisure gym, gardening      Cognition   Overall Cognitive Status Within Functional Limits for tasks assessed      Sensation   Light Touch Appears Intact      Coordination   Gross Motor Movements are Fluid and Coordinated Yes      Posture/Postural Control   Posture/Postural Control Postural limitations    Postural Limitations Rounded Shoulders      ROM / Strength   AROM / PROM /  Strength AROM;Strength      AROM   AROM Assessment Site Lumbar    Lumbar Flexion toes    Lumbar Extension moderately limited   mild pain   Lumbar - Right Side Bend jt line    Lumbar - Left Side Bend jt line    Lumbar - Right Rotation WNL    Lumbar - Left Rotation WNL      Strength   Strength Assessment Site Hip;Knee;Ankle    Right/Left Hip Right;Left    Right Hip Flexion 4/5    Right Hip ABduction 4+/5    Right Hip ADduction 4+/5    Left Hip Flexion 4/5    Left Hip ABduction 4+/5    Left Hip ADduction 4+/5    Right/Left Knee Right;Left    Right Knee Flexion 4/5    Right Knee Extension 4+/5    Left Knee Flexion 4/5    Left Knee Extension 4+/5    Right/Left Ankle Right;Left    Right Ankle Dorsiflexion 4+/5    Right Ankle Plantar Flexion 4+/5    Left Ankle Dorsiflexion 4+/5    Left Ankle Plantar Flexion 4+/5      Flexibility   Soft Tissue Assessment /Muscle Length yes    Hamstrings B WNL    Quadriceps R mildly tight, L WNL    Piriformis R mildly tight, L WNL      Palpation   Spinal mobility no TTP with central PAs    Palpation comment slight TTP and increased tension over B piriformis      Ambulation/Gait   Assistive device None    Gait Pattern Within Functional Limits    Ambulation Surface Level;Indoor    Gait velocity WNL                      Objective measurements completed on examination: See above findings.               PT Education - 09/09/19 1059    Education Details prognosis, HEP    Person(s) Educated Patient    Methods Explanation;Demonstration;Tactile cues;Verbal cues;Handout    Comprehension Verbalized understanding            PT Short Term Goals - 09/09/19 1106      PT SHORT TERM GOAL #1   Title Patient to report understanding of initial HEP.    Status  Achieved    Target Date 09/09/19             PT Long Term Goals - 09/09/19 1106      PT LONG TERM GOAL #1   Title Patient to report understanding of initial  HEP.    Period Days    Status Achieved    Target Date 09/09/19                  Plan - 09/09/19 1100    Clinical Impression Statement Patient is a 64y/o F presenting to OPPT with c/o chronic insidious R sided LBP with radiation down the R LE since 2018. Notes radiation of pain and N/T down to the mid-calf. Worse when using the elliptical for an hour and with heavy lifting. Better with flexion. Patient is active at the gym 3 times a week and likes to garden. Patient today presenting with rounded shoulders, limited and mildly painful lumbar extension ROM, B hip flexor weakness, tightness in R piriformis and hip flexor, and slight TTP and increased tension over B piriformis. Patient was educated on gentle stretching and strengthening HEP and reported understanding. Patient opting for 1 time visit today with 30 day hold as needed.    Personal Factors and Comorbidities Age;Comorbidity 2;Past/Current Experience;Fitness;Time since onset of injury/illness/exacerbation    Comorbidities HTN, HLD    Examination-Activity Limitations Squat;Bend;Carry;Lift;Reach Overhead    Examination-Participation Restrictions Church;Shop;Cleaning;Community Activity;Volunteer;Yard Work;Laundry;Meal Prep    Stability/Clinical Decision Making Stable/Uncomplicated    Clinical Decision Making Low    Rehab Potential Good    PT Frequency One time visit    PT Treatment/Interventions ADLs/Self Care Home Management;Cryotherapy;Electrical Stimulation;Moist Heat;Traction;Balance training;Therapeutic exercise;Therapeutic activities;Functional mobility training;Stair training;Gait training;Ultrasound;Neuromuscular re-education;Patient/family education;Manual techniques;Taping;Energy conservation;Dry needling;Passive range of motion    PT Next Visit Plan 30 day hold at this time    Consulted and Agree with Plan of Care Patient           Patient will benefit from skilled therapeutic intervention in order to improve the  following deficits and impairments:  Decreased activity tolerance, Increased fascial restricitons, Pain, Increased muscle spasms, Improper body mechanics, Decreased range of motion, Impaired flexibility, Postural dysfunction  Visit Diagnosis: Chronic right-sided low back pain with right-sided sciatica  Other symptoms and signs involving the musculoskeletal system     Problem List Patient Active Problem List   Diagnosis Date Noted  . Peripheral vascular disease ([place]) 04/25/2018  . Dyslipidemia 04/25/2018  . Dyspnea on exertion 04/25/2018  . Estrogen deficiency 12/21/2017  . Diarrhea 12/22/2016  . OAB (overactive bladder) 01/24/2013  . Obesity (BMI 30-39.9) 01/24/2013  . Anxiety and depression 05/04/2012  . Atrophic vaginitis 01/03/2012  . Hyperlipidemia 08/10/2010  . Preventative health care 07/08/2010     Janene Harvey, PT, DPT 09/09/19 11:08 AM   [facility] [address]  [place], [place], [address] Phone: [phone]   Fax:  [phone]  Name: Sherry Ross MRN: [id number] Date of Birth: 05-Sep-1955   PHYSICAL THERAPY DISCHARGE SUMMARY  Visits from Start of Care: 1  Current functional level related to goals / functional outcomes: See above clinical impression; patient did not return after 30 day hold   Remaining deficits: See above   Education / Equipment: HEP  Plan: Patient agrees to discharge.  Patient goals were met. Patient is being discharged due to the patient's request.  ?????     Janene Harvey, PT, DPT 10/15/19 10:32 AM

## 2019-09-18 DIAGNOSIS — Z20828 Contact with and (suspected) exposure to other viral communicable diseases: Secondary | ICD-10-CM | POA: Diagnosis not present

## 2019-09-23 ENCOUNTER — Encounter: Payer: Self-pay | Admitting: Family Medicine

## 2019-09-25 ENCOUNTER — Other Ambulatory Visit: Payer: Self-pay | Admitting: Oncology

## 2019-09-25 DIAGNOSIS — U071 COVID-19: Secondary | ICD-10-CM

## 2019-09-25 NOTE — Progress Notes (Signed)
I connected by phone with  Miss. Sherry Ross on 09/25/2019 at 4:20pm to discuss the potential use of an new treatment for mild to moderate COVID-19 viral infection in non-hospitalized patients.   This patient is a age/sex that meets the FDA criteria for Emergency Use Authorization of casirivimab\imdevimab.  Has a (+) direct SARS-CoV-2 viral test result 1. Has mild or moderate COVID-19  2. Is ? 65 years of age and weighs ? 40 kg 3. Is NOT hospitalized due to COVID-19 4. Is NOT requiring oxygen therapy or requiring an increase in baseline oxygen flow rate due to COVID-19 5. Is within 10 days of symptom onset 6. Has at least one of the high risk factor(s) for progression to severe COVID-19 and/or hospitalization as defined in EUA. ? Specific high risk criteria :Age   Symptom onset  09/21/2019.   I have spoken and communicated the following to the patient or parent/caregiver:   1. FDA has authorized the emergency use of casirivimab\imdevimab for the treatment of mild to moderate COVID-19 in adults and pediatric patients with positive results of direct SARS-CoV-2 viral testing who are 19 years of age and older weighing at least 40 kg, and who are at high risk for progressing to severe COVID-19 and/or hospitalization.   2. The significant known and potential risks and benefits of casirivimab\imdevimab, and the extent to which such potential risks and benefits are unknown.   3. Information on available alternative treatments and the risks and benefits of those alternatives, including clinical trials.   4. Patients treated with casirivimab\imdevimab should continue to self-isolate and use infection control measures (e.g., wear mask, isolate, social distance, avoid sharing personal items, clean and disinfect "high touch" surfaces, and frequent handwashing) according to CDC guidelines.    5. The patient or parent/caregiver has the option to accept or refuse casirivimab\imdevimab .   After reviewing this  information with the patient, The patient agreed to proceed with receiving casirivimab\imdevimab infusion and will be provided a copy of the Fact sheet prior to receiving the infusion.Rulon Abide, AGNP-C [phone] ([place])

## 2019-09-26 MED ORDER — SODIUM CHLORIDE 0.9 % IV SOLN
1200.0000 mg | Freq: Once | INTRAVENOUS | Status: AC
  Administered 2019-09-27: 1200 mg via INTRAVENOUS
  Filled 2019-09-26: qty 1200

## 2019-09-27 ENCOUNTER — Ambulatory Visit (HOSPITAL_COMMUNITY)
Admission: RE | Admit: 2019-09-27 | Discharge: 2019-09-27 | Disposition: A | Discharge status: Home / Self Care | Payer: PPO | Source: Ambulatory Visit | Attending: Pulmonary Disease | Admitting: Pulmonary Disease

## 2019-09-27 DIAGNOSIS — U071 COVID-19: Secondary | ICD-10-CM

## 2019-09-27 DIAGNOSIS — Z23 Encounter for immunization: Secondary | ICD-10-CM | POA: Insufficient documentation

## 2019-09-27 MED ORDER — FAMOTIDINE IN NACL 20-0.9 MG/50ML-% IV SOLN: 20.0000 mg | Freq: Once | INTRAVENOUS | Status: DC | PRN

## 2019-09-27 MED ORDER — SODIUM CHLORIDE 0.9 % IV SOLN: INTRAVENOUS | Status: DC | PRN

## 2019-09-27 MED ORDER — DIPHENHYDRAMINE HCL 50 MG/ML IJ SOLN: 50.0000 mg | Freq: Once | INTRAMUSCULAR | Status: DC | PRN

## 2019-09-27 MED ORDER — ALBUTEROL SULFATE HFA 108 (90 BASE) MCG/ACT IN AERS: 2.0000 | INHALATION_SPRAY | Freq: Once | RESPIRATORY_TRACT | Status: DC | PRN

## 2019-09-27 MED ORDER — METHYLPREDNISOLONE SODIUM SUCC 125 MG IJ SOLR: 125.0000 mg | Freq: Once | INTRAMUSCULAR | Status: DC | PRN

## 2019-09-27 MED ORDER — EPINEPHRINE 0.3 MG/0.3ML IJ SOAJ: 0.3000 mg | Freq: Once | INTRAMUSCULAR | Status: DC | PRN

## 2019-09-27 NOTE — Discharge Instructions (Signed)

## 2019-09-27 NOTE — Progress Notes (Signed)
  Diagnosis: COVID-19  Physician:Dr Joya Gaskins  Procedure: [facility] Med: casirivimab\imdevimab infusion - Provided patient with casirivimab\imdevimab fact sheet for patients, parents and caregivers prior to infusion.  Complications: No immediate complications noted.  Discharge: Discharged home   [place], [employer] 09/27/2019

## 2019-10-09 ENCOUNTER — Encounter: Payer: Self-pay | Admitting: Family Medicine

## 2019-10-10 ENCOUNTER — Encounter: Payer: Self-pay | Admitting: Family Medicine

## 2019-10-10 ENCOUNTER — Other Ambulatory Visit: Payer: Self-pay

## 2019-10-10 ENCOUNTER — Telehealth (INDEPENDENT_AMBULATORY_CARE_PROVIDER_SITE_OTHER): Payer: PPO | Admitting: Family Medicine

## 2019-10-10 DIAGNOSIS — R058 Other specified cough: Secondary | ICD-10-CM

## 2019-10-10 DIAGNOSIS — Z20822 Contact with and (suspected) exposure to covid-19: Secondary | ICD-10-CM

## 2019-10-10 DIAGNOSIS — R05 Cough: Secondary | ICD-10-CM | POA: Diagnosis not present

## 2019-10-10 MED ORDER — HYDROCODONE-HOMATROPINE 5-1.5 MG/5ML PO SYRP: 5.0000 mL | ORAL_SOLUTION | Freq: Three times a day (TID) | ORAL | 0 refills | Status: DC | PRN

## 2019-10-10 NOTE — Progress Notes (Signed)
Virtual Visit via Video Note  I connected with Sherry Ross on 10/10/19 at  4:00 PM EDT by a video enabled telemedicine application and verified that I am speaking with the correct person using two identifiers.  Location: Patient: home alone  Provider: office    I discussed the limitations of evaluation and management by telemedicine and the availability of in person appointments. The patient expressed understanding and agreed to proceed.  History of Present Illness: Pt is home c/o cough after testing + on 10 and 12 th and had the infusion on 13 th.   She is almost completely better except prod cough.   She has been taking aleve.    she is just requesting a cough meds  Observations/Objective: There were no vitals filed for this visit. Pt is in NAD    Assessment and Plan: 1. Cough with exposure to COVID-19 virus con't aleve prn Cough meds sent and call back if symptoms do not completely resolve She will be able to get the vaccine in 90 days  - HYDROcodone-homatropine (HYCODAN) 5-1.5 MG/5ML syrup; Take 5 mLs by mouth every 8 (eight) hours as needed for cough.  Dispense: 120 mL; Refill: 0   Follow Up Instructions:    I discussed the assessment and treatment plan with the patient. The patient was provided an opportunity to ask questions and all were answered. The patient agreed with the plan and demonstrated an understanding of the instructions.   The patient was advised to call back or seek an in-person evaluation if the symptoms worsen or if the condition fails to improve as anticipated.  I provided 25 minutes of non-face-to-face time during this encounter.   Ann Held, DO

## 2019-12-16 DIAGNOSIS — L300 Nummular dermatitis: Secondary | ICD-10-CM | POA: Diagnosis not present

## 2019-12-16 DIAGNOSIS — L718 Other rosacea: Secondary | ICD-10-CM | POA: Diagnosis not present

## 2019-12-16 DIAGNOSIS — R21 Rash and other nonspecific skin eruption: Secondary | ICD-10-CM | POA: Diagnosis not present

## 2020-02-26 DIAGNOSIS — D485 Neoplasm of uncertain behavior of skin: Secondary | ICD-10-CM | POA: Diagnosis not present

## 2020-02-26 DIAGNOSIS — Z85820 Personal history of malignant melanoma of skin: Secondary | ICD-10-CM | POA: Diagnosis not present

## 2020-02-26 DIAGNOSIS — L820 Inflamed seborrheic keratosis: Secondary | ICD-10-CM | POA: Diagnosis not present

## 2020-02-26 DIAGNOSIS — D1801 Hemangioma of skin and subcutaneous tissue: Secondary | ICD-10-CM | POA: Diagnosis not present

## 2020-02-26 DIAGNOSIS — C44719 Basal cell carcinoma of skin of left lower limb, including hip: Secondary | ICD-10-CM | POA: Diagnosis not present

## 2020-02-26 DIAGNOSIS — L814 Other melanin hyperpigmentation: Secondary | ICD-10-CM | POA: Diagnosis not present

## 2020-02-26 DIAGNOSIS — X32XXXS Exposure to sunlight, sequela: Secondary | ICD-10-CM | POA: Diagnosis not present

## 2020-03-03 ENCOUNTER — Encounter: Payer: Self-pay | Admitting: Family Medicine

## 2020-03-03 DIAGNOSIS — M5136 Other intervertebral disc degeneration, lumbar region: Secondary | ICD-10-CM | POA: Diagnosis not present

## 2020-03-04 ENCOUNTER — Telehealth (INDEPENDENT_AMBULATORY_CARE_PROVIDER_SITE_OTHER): Payer: PPO | Admitting: Family

## 2020-03-04 DIAGNOSIS — R030 Elevated blood-pressure reading, without diagnosis of hypertension: Secondary | ICD-10-CM | POA: Diagnosis not present

## 2020-03-04 NOTE — Progress Notes (Signed)
Virtual Visit via Video Note  I connected with Sherry Ross on 03/04/20 at 12:20 PM EST by a video enabled telemedicine application and verified that I am speaking with the correct person using two identifiers.  Location: Patient: home Provider: work   I discussed the limitations of evaluation and management by telemedicine and the availability of in person appointments. The patient expressed understanding and agreed to proceed. Only the patient and myself were present for today's video call.   History of Present Illness:  Patient is a 65 yr old female who presents today due to concern over home blood pressure readings. She reports bp often runs 140's/81. She has started taking her bp on a regular basis.  Today she took her bp several times within a matter of minutes and got the following readings:  125/71(most recent) 140/78 156/81   BP Readings from Last 3 Encounters:  09/27/19 (!) 121/94  04/25/18 130/70  12/21/17 130/80   Weight now is 180 lbs Wt Readings from Last 3 Encounters:  04/25/18 176 lb (79.8 kg)  12/21/17 171 lb (77.6 kg)  07/20/17 175 lb (79.4 kg)      Observations/Objective:   Gen: Awake, alert, no acute distress Resp: Breathing is even and non-labored Psych: calm/pleasant demeanor Neuro: Alert and Oriented x 3, + facial symmetry, speech is clear.   Assessment and Plan:  Elevated blood pressure reading- with her final bp reading of 125/71, I am concerned that addition of an antihypertensive may drop her too low and cause dizziness/increased fall risk.  I advised the patient as follows:  Check blood pressure once daily (vary the times of day that she checks). Schedule follow up with PCP in 1 month and bring home readings with her to her appointment. Work on low sodium diet and weight loss. Call sooner if consistent readings >150/90.  Pt verbalizes understanding.     Follow Up Instructions:    I discussed the assessment and treatment plan with  the patient. The patient was provided an opportunity to ask questions and all were answered. The patient agreed with the plan and demonstrated an understanding of the instructions.   The patient was advised to call back or seek an in-person evaluation if the symptoms worsen or if the condition fails to improve as anticipated.  Nance Pear, NP

## 2020-03-09 DIAGNOSIS — C44719 Basal cell carcinoma of skin of left lower limb, including hip: Secondary | ICD-10-CM | POA: Diagnosis not present

## 2020-04-28 ENCOUNTER — Other Ambulatory Visit: Payer: Self-pay | Admitting: Family Medicine

## 2020-04-28 DIAGNOSIS — E785 Hyperlipidemia, unspecified: Secondary | ICD-10-CM

## 2020-05-15 DIAGNOSIS — Z1231 Encounter for screening mammogram for malignant neoplasm of breast: Secondary | ICD-10-CM | POA: Diagnosis not present

## 2020-05-15 LAB — HM MAMMOGRAPHY

## 2020-06-08 DIAGNOSIS — L720 Epidermal cyst: Secondary | ICD-10-CM | POA: Diagnosis not present

## 2020-06-08 DIAGNOSIS — X32XXXS Exposure to sunlight, sequela: Secondary | ICD-10-CM | POA: Diagnosis not present

## 2020-06-08 DIAGNOSIS — L821 Other seborrheic keratosis: Secondary | ICD-10-CM | POA: Diagnosis not present

## 2020-06-08 DIAGNOSIS — L814 Other melanin hyperpigmentation: Secondary | ICD-10-CM | POA: Diagnosis not present

## 2020-06-08 DIAGNOSIS — Z85828 Personal history of other malignant neoplasm of skin: Secondary | ICD-10-CM | POA: Diagnosis not present

## 2020-08-12 DIAGNOSIS — M25562 Pain in left knee: Secondary | ICD-10-CM | POA: Diagnosis not present

## 2020-08-12 DIAGNOSIS — M5417 Radiculopathy, lumbosacral region: Secondary | ICD-10-CM | POA: Diagnosis not present

## 2020-09-04 DIAGNOSIS — M25562 Pain in left knee: Secondary | ICD-10-CM | POA: Diagnosis not present

## 2020-09-23 DIAGNOSIS — M25562 Pain in left knee: Secondary | ICD-10-CM | POA: Diagnosis not present

## 2020-11-23 DIAGNOSIS — Z85828 Personal history of other malignant neoplasm of skin: Secondary | ICD-10-CM | POA: Diagnosis not present

## 2020-11-23 DIAGNOSIS — Z85820 Personal history of malignant melanoma of skin: Secondary | ICD-10-CM | POA: Diagnosis not present

## 2020-11-23 DIAGNOSIS — L570 Actinic keratosis: Secondary | ICD-10-CM | POA: Diagnosis not present

## 2020-11-23 DIAGNOSIS — L814 Other melanin hyperpigmentation: Secondary | ICD-10-CM | POA: Diagnosis not present

## 2020-11-23 DIAGNOSIS — D1801 Hemangioma of skin and subcutaneous tissue: Secondary | ICD-10-CM | POA: Diagnosis not present

## 2020-11-23 DIAGNOSIS — X32XXXS Exposure to sunlight, sequela: Secondary | ICD-10-CM | POA: Diagnosis not present

## 2021-01-14 DIAGNOSIS — F424 Excoriation (skin-picking) disorder: Secondary | ICD-10-CM | POA: Diagnosis not present

## 2021-02-19 NOTE — Progress Notes (Signed)
Subjective:   Sherry Ross is a 66 y.o. female who presents for an Initial Medicare Annual Wellness Visit.   I connected with Sherry Ross today by telephone and verified that I am speaking with the correct person using two identifiers. Location patient: home Location provider: work Persons participating in the virtual visit: patient, Marine scientist.    I discussed the limitations, risks, security and privacy concerns of performing an evaluation and management service by telephone and the availability of in person appointments. I also discussed with the patient that there may be a patient responsible charge related to this service. The patient expressed understanding and verbally consented to this telephonic visit.    Interactive audio and video telecommunications were attempted between this provider and patient, however failed, due to patient having technical difficulties OR patient did not have access to video capability.  We continued and completed visit with audio only.  Some vital signs may be absent or patient reported.   Time Spent with patient on telephone encounter: 20 minutes  Review of Systems     Cardiac Risk Factors include: advanced age (>20men, >26 women);hypertension;dyslipidemia     Objective:    Today's Vitals   02/22/21 0830  Weight: 167 lb (75.8 kg)  Height: 5\' 6"  (1.676 m)   Body mass index is 26.95 kg/m.  Advanced Directives 02/22/2021 09/09/2019  Does Patient Have a Medical Advance Directive? Yes Yes  Type of Paramedic of [place];Living will -  Does patient want to make changes to medical advance directive? - No - Patient declined  Copy of [place] in Chart? No - copy requested -    Current Medications (verified) Outpatient Encounter Medications as of 02/22/2021  Medication Sig   Coenzyme Q10 100 MG capsule Take 100 mg by mouth daily.    gabapentin (NEURONTIN) 100 MG capsule Take 100 mg by mouth 2 (two) times daily.    Probiotic Product (PROBIOTIC-10 PO) Take 1 capsule by mouth every other day.   rosuvastatin (CRESTOR) 10 MG tablet TAKE 1 TABLET BY MOUTH EVERY OTHER DAY   TURMERIC PO Take 500 mg by mouth daily.    No facility-administered encounter medications on file as of 02/22/2021.    Allergies (verified) Patient has no known allergies.   History: Past Medical History:  Diagnosis Date   Hyperlipidemia    Hypertension    Past Surgical History:  Procedure Laterality Date   ABDOMINAL HYSTERECTOMY  1995   CHOLECYSTECTOMY  1999   [facility]a     Family History  Problem Relation Age of Onset   Alcohol abuse Father    Uterine cancer Mother    Breast cancer Mother    Heart disease Brother    Hypertension Brother    Diabetes Brother    Social History   Socioeconomic History   Marital status: Married    Spouse name: Not on file   Number of children: Not on file   Years of education: Not on file   Highest education level: Not on file  Occupational History   Occupation: medical billing    Comment: retired  Tobacco Use   Smoking status: Former    Packs/day: 0.80    Years: 4.00    Pack years: 3.20    Types: Cigarettes    Quit date: 02/15/1983    Years since quitting: 38.0   Smokeless tobacco: Never  Substance and Sexual Activity   Alcohol use: Yes   Drug use: No   Sexual activity: Not  Currently  Other Topics Concern   Not on file  Social History Narrative   Not on file   Social Determinants of Health   Financial Resource Strain: Low Risk    Difficulty of Paying Living Expenses: Not hard at all  Food Insecurity: No Food Insecurity   Worried About Charity fundraiser in the Last Year: Never true   [place] in the Last Year: Never true  Transportation Needs: No Transportation Needs   Lack of Transportation (Medical): No   Lack of Transportation (Non-Medical): No  Physical Activity: Sufficiently Active   Days of Exercise per Week: 4 days   Minutes of Exercise per Session: 60  min  Stress: No Stress Concern Present   Feeling of Stress : Not at all  Social Connections: Socially Integrated   Frequency of Communication with Friends and Family: More than three times a week   Frequency of Social Gatherings with Friends and Family: More than three times a week   Attends Religious Services: More than 4 times per year   Active Member of [employer] or Organizations: Yes   Attends Music therapist: More than 4 times per year   Marital Status: Married    Tobacco Counseling Counseling given: Not Answered   Clinical Intake:  Pre-visit preparation completed: Yes  Pain : No/denies pain     BMI - recorded: 26.95 Nutritional Status: BMI 25 -29 Overweight Nutritional Risks: None Diabetes: No  How often do you need to have someone help you when you read instructions, pamphlets, or other written materials from your doctor or pharmacy?: 1 - Never  Diabetic?No  Interpreter Needed?: No  Information entered by :: Caroleen Hamman LPN   Activities of Daily Living In your present state of health, do you have any difficulty performing the following activities: 02/22/2021 03/04/2020  Hearing? N N  Vision? N N  Difficulty concentrating or making decisions? N N  Walking or climbing stairs? N N  Dressing or bathing? N N  Doing errands, shopping? N N  Preparing Food and eating ? N -  Using the Toilet? N -  In the past six months, have you accidently leaked urine? N -  Do you have problems with loss of bowel control? N -  Managing your Medications? N -  Managing your Finances? N -  Housekeeping or managing your Housekeeping? N -  Some recent data might be hidden    Patient Care Team: Carollee Herter, Alferd Apa, DO as PCP - General (Family Medicine) Jari Pigg, MD as Consulting Physician (Dermatology)  Indicate any recent Medical Services you may have received from other than [facility] providers in the past year (date may be approximate).     Assessment:   This is  a routine wellness examination for Sherry Ross.  Hearing/Vision screen Hearing Screening - Comments:: No issues Vision Screening - Comments:: Last eye exam-02/2021-[facility]  Dietary issues and exercise activities discussed: Current Exercise Habits: Home exercise routine, Type of exercise: treadmill;strength training/weights, Time (Minutes): 60, Frequency (Times/Week): 4, Weekly Exercise (Minutes/Week): 240, Intensity: Mild, Exercise limited by: None identified   Goals Addressed             This Visit's Progress    Patient Stated       Continue to lose weight & exercise       Depression Screen PHQ 2/9 Scores 02/22/2021 06/20/2017 01/24/2013  PHQ - 2 Score 0 0 0    Fall Risk Fall Risk  02/22/2021  01/24/2013  Falls in the past year? 0 No  Number falls in past yr: 0 -  Injury with Fall? 0 -  Follow up Falls prevention discussed -    FALL RISK PREVENTION PERTAINING TO THE HOME:  Any stairs in or around the home? No  Home free of loose throw rugs in walkways, pet beds, electrical cords, etc? Yes  Adequate lighting in your home to reduce risk of falls? Yes   ASSISTIVE DEVICES UTILIZED TO PREVENT FALLS:  Life alert? No  Use of a cane, walker or w/c? No  Grab bars in the bathroom? No  Shower chair or bench in shower? No  Elevated toilet seat or a handicapped toilet? No   TIMED UP AND GO:  Was the test performed? No . Phone visit   Cognitive Function:Normal cognitive status assessed by this Nurse Health Advisor. No abnormalities found.          Immunizations Immunization History  Administered Date(s) Administered   Hepatitis A 07/20/2011   Influenza Inj Mdck Quad Pf 11/03/2017   Influenza Split 11/01/2010   Influenza Whole 11/04/2011   Influenza, Quadrivalent, Recombinant, Inj, Pf 11/01/2018   Influenza, Seasonal, Injecte, Preservative Fre 03/01/2015   Influenza,inj,Quad PF,6+ Mos 11/20/2012, 11/15/2013, 12/22/2016   Tdap 01/24/2013   Zoster Recombinat (Shingrix)  10/14/2017, 12/20/2017   Zoster, Live 04/09/2015    TDAP status: Up to date  Flu Vaccine status: Due, Education has been provided regarding the importance of this vaccine. Advised may receive this vaccine at local pharmacy or Health Dept. Aware to provide a copy of the vaccination record if obtained from local pharmacy or Health Dept. Verbalized acceptance and understanding.  Pneumococcal vaccine status: Due, Education has been provided regarding the importance of this vaccine. Advised may receive this vaccine at local pharmacy or Health Dept. Aware to provide a copy of the vaccination record if obtained from local pharmacy or Health Dept. Verbalized acceptance and understanding.  Covid-19 vaccine status: Declined, Education has been provided regarding the importance of this vaccine but patient still declined. Advised may receive this vaccine at local pharmacy or Health Dept.or vaccine clinic. Aware to provide a copy of the vaccination record if obtained from local pharmacy or Health Dept. Verbalized acceptance and understanding.  Qualifies for Shingles Vaccine? No   Zostavax completed Yes   Shingrix Completed?: Yes  Screening Tests Health Maintenance  Topic Date Due   COVID-19 Vaccine (1) Never done   Pneumonia Vaccine 63+ Years old (1 - PCV) Never done   INFLUENZA VACCINE  09/14/2020   MAMMOGRAM  05/15/2021   COLONOSCOPY (Pts 45-26yrs Insurance coverage will need to be confirmed)  11/20/2021   TETANUS/TDAP  01/25/2023   DEXA SCAN  Completed   Hepatitis C Screening  Completed   Zoster Vaccines- Shingrix  Completed   HPV VACCINES  Aged Out    Health Maintenance  Health Maintenance Due  Topic Date Due   COVID-19 Vaccine (1) Never done   Pneumonia Vaccine 34+ Years old (1 - PCV) Never done   INFLUENZA VACCINE  09/14/2020    Colorectal cancer screening: Type of screening: Colonoscopy. Completed 2022-per patient. Repeat every 10 years. Patient plans to bring report to next office  visit.  Mammogram status: Completed bilateral 05/15/2020. Repeat every year  Bone Density status: Ordered today. Pt provided with contact info and advised to call to schedule appt.  Lung Cancer Screening: (Low Dose CT Chest recommended if Age 61-80 years, 30 pack-year currently smoking OR have quit w/in  15years.) does not qualify.     Additional Screening:  Hepatitis C Screening: Completed 04/09/2015  Vision Screening: Recommended annual ophthalmology exams for early detection of glaucoma and other disorders of the eye. Is the patient up to date with their annual eye exam?  Yes  Who is the provider or what is the name of the office in which the patient attends annual eye exams? [facility]  Dental Screening: Recommended annual dental exams for proper oral hygiene  Community Resource Referral / Chronic Care Management: CRR required this visit?  No   CCM required this visit?  No      Plan:     I have personally reviewed and noted the following in the patients chart:   Medical and social history Use of alcohol, tobacco or illicit drugs  Current medications and supplements including opioid prescriptions. Patient is not currently taking opioid prescriptions. Functional ability and status Nutritional status Physical activity Advanced directives List of other physicians Hospitalizations, surgeries, and ER visits in previous 12 months Vitals Screenings to include cognitive, depression, and falls Referrals and appointments  In addition, I have reviewed and discussed with patient certain preventive protocols, quality metrics, and best practice recommendations. A written personalized care plan for preventive services as well as general preventive health recommendations were provided to patient.   Due to this being a telephonic visit, the after visit summary with patients personalized plan was offered to patient via mail or my-chart. Patient would like to access on  my-chart.   Marta Antu, LPN   04/17/1961  Nurse Health Advisor  Nurse Notes: None

## 2021-02-22 ENCOUNTER — Ambulatory Visit (INDEPENDENT_AMBULATORY_CARE_PROVIDER_SITE_OTHER): Payer: PPO

## 2021-02-22 VITALS — Ht 66.0 in | Wt 167.0 lb

## 2021-02-22 DIAGNOSIS — Z0000 Encounter for general adult medical examination without abnormal findings: Secondary | ICD-10-CM

## 2021-02-22 DIAGNOSIS — Z780 Asymptomatic menopausal state: Secondary | ICD-10-CM | POA: Diagnosis not present

## 2021-02-22 NOTE — Patient Instructions (Signed)
Sherry Ross , Thank you for taking time to complete your Medicare Wellness Visit. I appreciate your ongoing commitment to your health goals. Please review the following plan we discussed and let me know if I can assist you in the future.   Screening recommendations/referrals: Colonoscopy: Per our conversation, completed 2022. Mammogram: Completed 05/15/2020-Due 05/15/2021 Bone Density: Ordered today. Someone will call you to schedule. Recommended yearly ophthalmology/optometry visit for glaucoma screening and checkup Recommended yearly dental visit for hygiene and checkup  Vaccinations: Influenza vaccine: Due-May obtain vaccine at our office or your local pharmacy. Pneumococcal vaccine: Due-May obtain vaccine at our office or your local pharmacy. Tdap vaccine: Up to date Shingles vaccine: Completed vaccines   Covid-19:Declined  Advanced directives: Please bring a copy of Living Will and/or Healthcare Power of Attorney for your chart.   Conditions/risks identified: See problem list  Next appointment: Follow up in one year for your annual wellness visit 02/25/2022 @ 8:20   Preventive Care 67 Years and Older, Female Preventive care refers to lifestyle choices and visits with your health care provider that can promote health and wellness. What does preventive care include? A yearly physical exam. This is also called an annual well check. Dental exams once or twice a year. Routine eye exams. Ask your health care provider how often you should have your eyes checked. Personal lifestyle choices, including: Daily care of your teeth and gums. Regular physical activity. Eating a healthy diet. Avoiding tobacco and drug use. Limiting alcohol use. Practicing safe sex. Taking low-dose aspirin every day. Taking vitamin and mineral supplements as recommended by your health care provider. What happens during an annual well check? The services and screenings done by your health care provider during  your annual well check will depend on your age, overall health, lifestyle risk factors, and family history of disease. Counseling  Your health care provider may ask you questions about your: Alcohol use. Tobacco use. Drug use. Emotional well-being. Home and relationship well-being. Sexual activity. Eating habits. History of falls. Memory and ability to understand (cognition). Work and work Statistician. Reproductive health. Screening  You may have the following tests or measurements: Height, weight, and BMI. Blood pressure. Lipid and cholesterol levels. These may be checked every 5 years, or more frequently if you are over 39 years old. Skin check. Lung cancer screening. You may have this screening every year starting at age 14 if you have a 30-pack-year history of smoking and currently smoke or have quit within the past 15 years. Fecal occult blood test (FOBT) of the stool. You may have this test every year starting at age 60. Flexible sigmoidoscopy or colonoscopy. You may have a sigmoidoscopy every 5 years or a colonoscopy every 10 years starting at age 51. Hepatitis C blood test. Hepatitis B blood test. Sexually transmitted disease (STD) testing. Diabetes screening. This is done by checking your blood sugar (glucose) after you have not eaten for a while (fasting). You may have this done every 1-3 years. Bone density scan. This is done to screen for osteoporosis. You may have this done starting at age 32. Mammogram. This may be done every 1-2 years. Talk to your health care provider about how often you should have regular mammograms. Talk with your health care provider about your test results, treatment options, and if necessary, the need for more tests. Vaccines  Your health care provider may recommend certain vaccines, such as: Influenza vaccine. This is recommended every year. Tetanus, diphtheria, and acellular pertussis (Tdap, Td) vaccine. You  may need a Td booster every 10  years. Zoster vaccine. You may need this after age 31. Pneumococcal 13-valent conjugate (PCV13) vaccine. One dose is recommended after age 37. Pneumococcal polysaccharide (PPSV23) vaccine. One dose is recommended after age 29. Talk to your health care provider about which screenings and vaccines you need and how often you need them. This information is not intended to replace advice given to you by your health care provider. Make sure you discuss any questions you have with your health care provider. Document Released: 02/27/2015 Document Revised: 10/21/2015 Document Reviewed: 12/02/2014 Elsevier Interactive Patient Education  2017 [place] Prevention in the Home Falls can cause injuries. They can happen to people of all ages. There are many things you can do to make your home safe and to help prevent falls. What can I do on the outside of my home? Regularly fix the edges of walkways and driveways and fix any cracks. Remove anything that might make you trip as you walk through a door, such as a raised step or threshold. Trim any bushes or trees on the path to your home. Use bright outdoor lighting. Clear any walking paths of anything that might make someone trip, such as rocks or tools. Regularly check to see if handrails are loose or broken. Make sure that both sides of any steps have handrails. Any raised decks and porches should have guardrails on the edges. Have any leaves, snow, or ice cleared regularly. Use sand or salt on walking paths during winter. Clean up any spills in your garage right away. This includes oil or grease spills. What can I do in the bathroom? Use night lights. Install grab bars by the toilet and in the tub and shower. Do not use towel bars as grab bars. Use non-skid mats or decals in the tub or shower. If you need to sit down in the shower, use a plastic, non-slip stool. Keep the floor dry. Clean up any water that spills on the floor as soon as it  happens. Remove soap buildup in the tub or shower regularly. Attach bath mats securely with double-sided non-slip rug tape. Do not have throw rugs and other things on the floor that can make you trip. What can I do in the bedroom? Use night lights. Make sure that you have a light by your bed that is easy to reach. Do not use any sheets or blankets that are too big for your bed. They should not hang down onto the floor. Have a firm chair that has side arms. You can use this for support while you get dressed. Do not have throw rugs and other things on the floor that can make you trip. What can I do in the kitchen? Clean up any spills right away. Avoid walking on wet floors. Keep items that you use a lot in easy-to-reach places. If you need to reach something above you, use a strong step stool that has a grab bar. Keep electrical cords out of the way. Do not use floor polish or wax that makes floors slippery. If you must use wax, use non-skid floor wax. Do not have throw rugs and other things on the floor that can make you trip. What can I do with my stairs? Do not leave any items on the stairs. Make sure that there are handrails on both sides of the stairs and use them. Fix handrails that are broken or loose. Make sure that handrails are as long as the  stairways. Check any carpeting to make sure that it is firmly attached to the stairs. Fix any carpet that is loose or worn. Avoid having throw rugs at the top or bottom of the stairs. If you do have throw rugs, attach them to the floor with carpet tape. Make sure that you have a light switch at the top of the stairs and the bottom of the stairs. If you do not have them, ask someone to add them for you. What else can I do to help prevent falls? Wear shoes that: Do not have high heels. Have rubber bottoms. Are comfortable and fit you well. Are closed at the toe. Do not wear sandals. If you use a stepladder: Make sure that it is fully opened.  Do not climb a closed stepladder. Make sure that both sides of the stepladder are locked into place. Ask someone to hold it for you, if possible. Clearly mark and make sure that you can see: Any grab bars or handrails. First and last steps. Where the edge of each step is. Use tools that help you move around (mobility aids) if they are needed. These include: Canes. Walkers. Scooters. Crutches. Turn on the lights when you go into a dark area. Replace any light bulbs as soon as they burn out. Set up your furniture so you have a clear path. Avoid moving your furniture around. If any of your floors are uneven, fix them. If there are any pets around you, be aware of where they are. Review your medicines with your doctor. Some medicines can make you feel dizzy. This can increase your chance of falling. Ask your doctor what other things that you can do to help prevent falls. This information is not intended to replace advice given to you by your health care provider. Make sure you discuss any questions you have with your health care provider. Document Released: 11/27/2008 Document Revised: 07/09/2015 Document Reviewed: 03/07/2014 Elsevier Interactive Patient Education  2017 [employer].

## 2021-02-23 ENCOUNTER — Telehealth: Payer: Self-pay

## 2021-02-23 ENCOUNTER — Other Ambulatory Visit: Payer: Self-pay | Admitting: Family Medicine

## 2021-02-23 DIAGNOSIS — E785 Hyperlipidemia, unspecified: Secondary | ICD-10-CM

## 2021-02-23 NOTE — Telephone Encounter (Signed)
Patient wants to know if its okay to get her bloodwork done before visit so it can be discussed at the visit on Friday , stated she only wants a lipid panel

## 2021-02-24 NOTE — Telephone Encounter (Signed)
Patient scheduled for tomorrow for lab only visit.

## 2021-02-25 ENCOUNTER — Other Ambulatory Visit (INDEPENDENT_AMBULATORY_CARE_PROVIDER_SITE_OTHER): Payer: PPO

## 2021-02-25 ENCOUNTER — Ambulatory Visit: Payer: PPO | Admitting: Family Medicine

## 2021-02-25 DIAGNOSIS — E785 Hyperlipidemia, unspecified: Secondary | ICD-10-CM

## 2021-02-25 LAB — LIPID PANEL
Cholesterol: 235 mg/dL — ABNORMAL HIGH (ref 0–200)
HDL: 49.3 mg/dL (ref >39.00)
LDL Cholesterol: 163 mg/dL — ABNORMAL HIGH (ref 0–99)
NonHDL: 186.02
Total CHOL/HDL Ratio: 5
Triglycerides: 117 mg/dL (ref 0.0–149.0)
VLDL: 23.4 mg/dL (ref 0.0–40.0)

## 2021-02-25 LAB — COMPREHENSIVE METABOLIC PANEL
ALT: 20 U/L (ref 0–35)
AST: 22 U/L (ref 0–37)
Albumin: 4.4 g/dL (ref 3.5–5.2)
Alkaline Phosphatase: 58 U/L (ref 39–117)
BUN: 12 mg/dL (ref 6–23)
CO2: 27 mEq/L (ref 19–32)
Calcium: 8.8 mg/dL (ref 8.4–10.5)
Chloride: 104 mEq/L (ref 96–112)
Creatinine, Ser: 0.63 mg/dL (ref 0.40–1.20)
GFR: 92.37 mL/min (ref >60.00)
Glucose, Bld: 101 mg/dL — ABNORMAL HIGH (ref 70–99)
Potassium: 4.4 mEq/L (ref 3.5–5.1)
Sodium: 140 mEq/L (ref 135–145)
Total Bilirubin: 0.7 mg/dL (ref 0.2–1.2)
Total Protein: 6.6 g/dL (ref 6.0–8.3)

## 2021-02-26 ENCOUNTER — Encounter: Payer: Self-pay | Admitting: Family Medicine

## 2021-02-26 ENCOUNTER — Ambulatory Visit (INDEPENDENT_AMBULATORY_CARE_PROVIDER_SITE_OTHER): Payer: PPO | Admitting: Family Medicine

## 2021-02-26 VITALS — BP 140/80 | HR 60 | Temp 98.0°F | Resp 18 | Ht 66.0 in | Wt 169.8 lb

## 2021-02-26 DIAGNOSIS — E785 Hyperlipidemia, unspecified: Secondary | ICD-10-CM | POA: Diagnosis not present

## 2021-02-26 NOTE — Assessment & Plan Note (Signed)
Re start statin Lab Results  Component Value Date   CHOL 235 (H) 02/25/2021   HDL 49.30 02/25/2021   LDLCALC 163 (H) 02/25/2021   LDLDIRECT 142.0 06/20/2017   TRIG 117.0 02/25/2021   CHOLHDL 5 02/25/2021

## 2021-02-26 NOTE — Progress Notes (Addendum)
Subjective:   By signing my name below, I, Zite Okoli, attest that this documentation has been prepared under the direction and in the presence of Ann Held, DO. 02/26/2021    Patient ID: Sherry Ross, female    DOB: 15-Oct-1955, 66 y.o.   MRN: [id number]  Chief Complaint  Patient presents with   Follow-up   Hyperlipidemia    HPI Patient is in today for an office visit and f/u   She saw her lab work yesterday and restarted 10 mg Crestor. She started intermittent fasting August 20 th and her IBS has resolved. Doing well on it. She uses dulcolax about twice a week to improve bowel movements.   She is not interested in the flu, pneumonia or Covid-19 vaccines.  Past Medical History:  Diagnosis Date   Hyperlipidemia    Hypertension     Past Surgical History:  Procedure Laterality Date   ABDOMINAL HYSTERECTOMY  1995   CHOLECYSTECTOMY  1999   [facility]      Family History  Problem Relation Age of Onset   Alcohol abuse Father    Uterine cancer Mother    Breast cancer Mother    Heart disease Brother    Hypertension Brother    Diabetes Brother     Social History   Socioeconomic History   Marital status: Married    Spouse name: Not on file   Number of children: Not on file   Years of education: Not on file   Highest education level: Not on file  Occupational History   Occupation: medical billing    Comment: retired  Tobacco Use   Smoking status: Former    Packs/day: 0.80    Years: 4.00    Pack years: 3.20    Types: Cigarettes    Quit date: 02/15/1983    Years since quitting: 38.0   Smokeless tobacco: Never  Substance and Sexual Activity   Alcohol use: Yes   Drug use: No   Sexual activity: Not Currently  Other Topics Concern   Not on file  Social History Narrative   Not on file   Social Determinants of Health   Financial Resource Strain: Low Risk    Difficulty of Paying Living Expenses: Not hard at all  Food Insecurity: No Food Insecurity    Worried About Charity fundraiser in the Last Year: Never true   [place] in the Last Year: Never true  Transportation Needs: No Transportation Needs   Lack of Transportation (Medical): No   Lack of Transportation (Non-Medical): No  Physical Activity: Sufficiently Active   Days of Exercise per Week: 4 days   Minutes of Exercise per Session: 60 min  Stress: No Stress Concern Present   Feeling of Stress : Not at all  Social Connections: Socially Integrated   Frequency of Communication with Friends and Family: More than three times a week   Frequency of Social Gatherings with Friends and Family: More than three times a week   Attends Religious Services: More than 4 times per year   Active Member of [employer] or Organizations: Yes   Attends Music therapist: More than 4 times per year   Marital Status: Married  Human resources officer Violence: Not At Risk   Fear of Current or Ex-Partner: No   Emotionally Abused: No   Physically Abused: No   Sexually Abused: No    Outpatient Medications Prior to Visit  Medication Sig Dispense Refill   rosuvastatin (CRESTOR)  10 MG tablet Take 10 mg by mouth every other day.     No facility-administered medications prior to visit.    No Known Allergies  Review of Systems  Constitutional:  Negative for fever.  HENT:  Negative for congestion, ear pain, hearing loss, sinus pain and sore throat.   Eyes:  Negative for blurred vision and pain.  Respiratory:  Negative for cough, sputum production, shortness of breath and wheezing.   Cardiovascular:  Negative for chest pain and palpitations.  Gastrointestinal:  Negative for blood in stool, constipation, diarrhea, nausea and vomiting.  Genitourinary:  Negative for dysuria, frequency, hematuria and urgency.  Musculoskeletal:  Negative for back pain, falls and myalgias.  Neurological:  Negative for dizziness, sensory change, loss of consciousness, weakness and headaches.  Endo/Heme/Allergies:   Negative for environmental allergies. Does not bruise/bleed easily.  Psychiatric/Behavioral:  Negative for depression and suicidal ideas. The patient is not nervous/anxious and does not have insomnia.       Objective:    Physical Exam Constitutional:      General: She is not in acute distress.    Appearance: Normal appearance. She is not ill-appearing.  HENT:     Head: Normocephalic and atraumatic.     Right Ear: External ear normal.     Left Ear: External ear normal.  Eyes:     Extraocular Movements: Extraocular movements intact.     Pupils: Pupils are equal, round, and reactive to light.  Cardiovascular:     Rate and Rhythm: Normal rate and regular rhythm.     Pulses: Normal pulses.     Heart sounds: Normal heart sounds. No murmur heard.   No gallop.  Pulmonary:     Effort: Pulmonary effort is normal. No respiratory distress.     Breath sounds: Normal breath sounds. No wheezing, rhonchi or rales.  Abdominal:     General: Bowel sounds are normal. There is no distension.     Palpations: Abdomen is soft. There is no mass.     Tenderness: There is no abdominal tenderness. There is no guarding or rebound.     Hernia: No hernia is present.  Musculoskeletal:     Cervical back: Normal range of motion and neck supple.  Lymphadenopathy:     Cervical: No cervical adenopathy.  Skin:    General: Skin is warm and dry.  Neurological:     Mental Status: She is alert and oriented to person, place, and time.  Psychiatric:        Behavior: Behavior normal.    BP 140/80 (BP Location: Left Arm, Patient Position: Sitting, Cuff Size: Normal)    Pulse 60    Temp 98 F (36.7 C) (Oral)    Resp 18    Ht 5\' 6"  (1.676 m)    Wt 169 lb 12.8 oz (77 kg)    LMP 02/14/1993    SpO2 99%    BMI 27.41 kg/m  Wt Readings from Last 3 Encounters:  02/26/21 169 lb 12.8 oz (77 kg)  02/22/21 167 lb (75.8 kg)  04/25/18 176 lb (79.8 kg)    Diabetic Foot Exam - Simple   No data filed    Lab Results   Component Value Date   WBC 5.1 06/20/2017   HGB 14.1 06/20/2017   HCT 40.5 06/20/2017   PLT 201.0 06/20/2017   GLUCOSE 101 (H) 02/25/2021   CHOL 235 (H) 02/25/2021   TRIG 117.0 02/25/2021   HDL 49.30 02/25/2021   LDLDIRECT 142.0 06/20/2017  LDLCALC 163 (H) 02/25/2021   ALT 20 02/25/2021   AST 22 02/25/2021   NA 140 02/25/2021   K 4.4 02/25/2021   CL 104 02/25/2021   CREATININE 0.63 02/25/2021   BUN 12 02/25/2021   CO2 27 02/25/2021   TSH 1.22 06/20/2017   HGBA1C 5.9 07/18/2018    Lab Results  Component Value Date   TSH 1.22 06/20/2017   Lab Results  Component Value Date   WBC 5.1 06/20/2017   HGB 14.1 06/20/2017   HCT 40.5 06/20/2017   MCV 89.7 06/20/2017   PLT 201.0 06/20/2017   Lab Results  Component Value Date   NA 140 02/25/2021   K 4.4 02/25/2021   CO2 27 02/25/2021   GLUCOSE 101 (H) 02/25/2021   BUN 12 02/25/2021   CREATININE 0.63 02/25/2021   BILITOT 0.7 02/25/2021   ALKPHOS 58 02/25/2021   AST 22 02/25/2021   ALT 20 02/25/2021   PROT 6.6 02/25/2021   ALBUMIN 4.4 02/25/2021   CALCIUM 8.8 02/25/2021   ANIONGAP 13 09/30/2014   GFR 92.37 02/25/2021   Lab Results  Component Value Date   CHOL 235 (H) 02/25/2021   Lab Results  Component Value Date   HDL 49.30 02/25/2021   Lab Results  Component Value Date   LDLCALC 163 (H) 02/25/2021   Lab Results  Component Value Date   TRIG 117.0 02/25/2021   Lab Results  Component Value Date   CHOLHDL 5 02/25/2021   Lab Results  Component Value Date   HGBA1C 5.9 07/18/2018       Assessment & Plan:  1. Hyperlipidemia, unspecified hyperlipidemia type Encourage heart healthy diet such as MIND or DASH diet, increase exercise, avoid trans fats, simple carbohydrates and processed foods, consider a krill or fish or flaxseed oil cap daily.   She will restart statin  Problem List Items Addressed This Visit       Unprioritized   Hyperlipidemia - Primary    Re start statin Lab Results  Component  Value Date   CHOL 235 (H) 02/25/2021   HDL 49.30 02/25/2021   LDLCALC 163 (H) 02/25/2021   LDLDIRECT 142.0 06/20/2017   TRIG 117.0 02/25/2021   CHOLHDL 5 02/25/2021        Relevant Medications   rosuvastatin (CRESTOR) 10 MG tablet     No orders of the defined types were placed in this encounter.   I,Zite Okoli,acting as a Education administrator for [employer], DO.,have documented all relevant documentation on the behalf of Ann Held, DO,as directed by  Ann Held, DO while in the presence of [place], DO. , personally preformed the services described in this documentation.  All medical record entries made by the scribe were at my direction and in my presence.  I have reviewed the chart and discharge instructions (if applicable) and agree that the record reflects my personal performance and is accurate and complete. 02/26/2021

## 2021-02-26 NOTE — Patient Instructions (Signed)
Cholesterol Content in Foods ?Cholesterol is a waxy, fat-like substance that helps to carry fat in the blood. The body needs cholesterol in small amounts, but too much cholesterol can cause damage to the arteries and heart. ?What foods have cholesterol? ?Cholesterol is found in animal-based foods, such as meat, seafood, and dairy. Generally, low-fat dairy and lean meats have less cholesterol than full-fat dairy and fatty meats. The milligrams of cholesterol per serving (mg per serving) of common cholesterol-containing foods are listed below. ?Meats and other proteins ?Egg -- one large whole egg has 186 mg. ?Veal shank -- 4 oz (113 g) has 141 mg. ?Lean ground turkey (93% lean) -- 4 oz (113 g) has 118 mg. ?Fat-trimmed lamb loin -- 4 oz (113 g) has 106 mg. ?Lean ground beef (90% lean) -- 4 oz (113 g) has 100 mg. ?Lobster -- 3.5 oz (99 g) has 90 mg. ?Pork loin chops -- 4 oz (113 g) has 86 mg. ?Canned salmon -- 3.5 oz (99 g) has 83 mg. ?Fat-trimmed beef top loin -- 4 oz (113 g) has 78 mg. ?Frankfurter -- 1 frank (3.5 oz or 99 g) has 77 mg. ?Crab -- 3.5 oz (99 g) has 71 mg. ?Roasted chicken without skin, white meat -- 4 oz (113 g) has 66 mg. ?Light bologna -- 2 oz (57 g) has 45 mg. ?Deli-cut turkey -- 2 oz (57 g) has 31 mg. ?Canned tuna -- 3.5 oz (99 g) has 31 mg. ?Bacon -- 1 oz (28 g) has 29 mg. ?Oysters and mussels (raw) -- 3.5 oz (99 g) has 25 mg. ?Mackerel -- 1 oz (28 g) has 22 mg. ?Trout -- 1 oz (28 g) has 20 mg. ?Pork sausage -- 1 link (1 oz or 28 g) has 17 mg. ?Salmon -- 1 oz (28 g) has 16 mg. ?Tilapia -- 1 oz (28 g) has 14 mg. ?Dairy ?Soft-serve ice cream -- ? cup (4 oz or 86 g) has 103 mg. ?Whole-milk yogurt -- 1 cup (8 oz or 245 g) has 29 mg. ?Cheddar cheese -- 1 oz (28 g) has 28 mg. ?American cheese -- 1 oz (28 g) has 28 mg. ?Whole milk -- 1 cup (8 oz or 250 mL) has 23 mg. ?2% milk -- 1 cup (8 oz or 250 mL) has 18 mg. ?Cream cheese -- 1 tablespoon (Tbsp) (14.5 g) has 15 mg. ?Cottage cheese -- ? cup (4 oz or 113  g) has 14 mg. ?Low-fat (1%) milk -- 1 cup (8 oz or 250 mL) has 10 mg. ?Sour cream -- 1 Tbsp (12 g) has 8.5 mg. ?Low-fat yogurt -- 1 cup (8 oz or 245 g) has 8 mg. ?Nonfat Greek yogurt -- 1 cup (8 oz or 228 g) has 7 mg. ?Half-and-half cream -- 1 Tbsp (15 mL) has 5 mg. ?Fats and oils ?Cod liver oil -- 1 tablespoon (Tbsp) (13.6 g) has 82 mg. ?Butter -- 1 Tbsp (14 g) has 15 mg. ?Lard -- 1 Tbsp (12.8 g) has 14 mg. ?Bacon grease -- 1 Tbsp (12.9 g) has 14 mg. ?Mayonnaise -- 1 Tbsp (13.8 g) has 5-10 mg. ?Margarine -- 1 Tbsp (14 g) has 3-10 mg. ?The items listed above may not be a complete list of foods with cholesterol. Exact amounts of cholesterol in these foods may vary depending on specific ingredients and brands. Contact a dietitian for more information. ?What foods do not have cholesterol? ?Most plant-based foods do not have cholesterol unless you combine them with a food that has cholesterol.   Foods without cholesterol include: ?Grains and cereals. ?Vegetables. ?Fruits. ?Vegetable oils, such as olive, canola, and sunflower oil. ?Legumes, such as peas, beans, and lentils. ?Nuts and seeds. ?Egg whites. ?The items listed above may not be a complete list of foods that do not have cholesterol. Contact a dietitian for more information. ?Summary ?The body needs cholesterol in small amounts, but too much cholesterol can cause damage to the arteries and heart. ?Cholesterol is found in animal-based foods, such as meat, seafood, and dairy. Generally, low-fat dairy and lean meats have less cholesterol than full-fat dairy and fatty meats. ?This information is not intended to replace advice given to you by your health care provider. Make sure you discuss any questions you have with your health care provider. ?Document Revised: 06/12/2020 Document Reviewed: 06/12/2020 ?Elsevier Patient Education ? 2022 Elsevier Inc. ? ?

## 2021-04-05 DIAGNOSIS — M25562 Pain in left knee: Secondary | ICD-10-CM | POA: Diagnosis not present

## 2021-04-08 ENCOUNTER — Other Ambulatory Visit (HOSPITAL_BASED_OUTPATIENT_CLINIC_OR_DEPARTMENT_OTHER): Payer: PPO

## 2021-04-08 ENCOUNTER — Other Ambulatory Visit: Payer: Self-pay

## 2021-04-08 ENCOUNTER — Ambulatory Visit (HOSPITAL_BASED_OUTPATIENT_CLINIC_OR_DEPARTMENT_OTHER)
Admission: RE | Admit: 2021-04-08 | Discharge: 2021-04-08 | Disposition: A | Discharge status: Home / Self Care | Payer: PPO | Source: Ambulatory Visit | Attending: Family Medicine | Admitting: Family Medicine

## 2021-04-08 DIAGNOSIS — Z780 Asymptomatic menopausal state: Secondary | ICD-10-CM | POA: Diagnosis not present

## 2021-04-19 DIAGNOSIS — Z20822 Contact with and (suspected) exposure to covid-19: Secondary | ICD-10-CM | POA: Diagnosis not present

## 2021-04-19 DIAGNOSIS — Z03818 Encounter for observation for suspected exposure to other biological agents ruled out: Secondary | ICD-10-CM | POA: Diagnosis not present

## 2021-04-21 DIAGNOSIS — J069 Acute upper respiratory infection, unspecified: Secondary | ICD-10-CM | POA: Diagnosis not present

## 2021-04-21 DIAGNOSIS — R509 Fever, unspecified: Secondary | ICD-10-CM | POA: Diagnosis not present

## 2021-04-21 DIAGNOSIS — R059 Cough, unspecified: Secondary | ICD-10-CM | POA: Diagnosis not present

## 2021-04-21 DIAGNOSIS — J029 Acute pharyngitis, unspecified: Secondary | ICD-10-CM | POA: Diagnosis not present

## 2021-04-25 ENCOUNTER — Encounter: Payer: Self-pay | Admitting: Family Medicine

## 2021-04-26 ENCOUNTER — Telehealth: Payer: Self-pay | Admitting: Family Medicine

## 2021-04-26 ENCOUNTER — Other Ambulatory Visit: Payer: Self-pay

## 2021-04-26 ENCOUNTER — Other Ambulatory Visit: Payer: Self-pay | Admitting: Family Medicine

## 2021-04-26 ENCOUNTER — Other Ambulatory Visit (INDEPENDENT_AMBULATORY_CARE_PROVIDER_SITE_OTHER): Payer: PPO

## 2021-04-26 DIAGNOSIS — N390 Urinary tract infection, site not specified: Secondary | ICD-10-CM

## 2021-04-26 DIAGNOSIS — R300 Dysuria: Secondary | ICD-10-CM

## 2021-04-26 DIAGNOSIS — R319 Hematuria, unspecified: Secondary | ICD-10-CM

## 2021-04-26 LAB — POC URINALSYSI DIPSTICK (AUTOMATED)
Glucose, UA: NEGATIVE
Nitrite, UA: POSITIVE
Protein, UA: POSITIVE — AB
Spec Grav, UA: 1.02 (ref 1.010–1.025)
Urobilinogen, UA: 0.2 E.U./dL
pH, UA: 6 (ref 5.0–8.0)

## 2021-04-26 MED ORDER — NITROFURANTOIN MONOHYD MACRO 100 MG PO CAPS: 100.0000 mg | ORAL_CAPSULE | Freq: Two times a day (BID) | ORAL | 0 refills | Status: DC

## 2021-04-26 NOTE — Telephone Encounter (Signed)
Pt had urine culture completed today  ? ? ?Pt stated she's currently burning and needs meds sent to [facility] on [address]  ? ? ?Please advise  ?

## 2021-04-26 NOTE — Telephone Encounter (Signed)
Rx sent by [facility] ?

## 2021-04-28 LAB — URINE CULTURE
MICRO NUMBER:: 13122092
SPECIMEN QUALITY:: ADEQUATE

## 2021-05-08 ENCOUNTER — Other Ambulatory Visit: Payer: Self-pay | Admitting: Family Medicine

## 2021-05-08 DIAGNOSIS — E785 Hyperlipidemia, unspecified: Secondary | ICD-10-CM

## 2021-05-20 DIAGNOSIS — Z1231 Encounter for screening mammogram for malignant neoplasm of breast: Secondary | ICD-10-CM | POA: Diagnosis not present

## 2021-05-20 LAB — HM MAMMOGRAPHY

## 2021-06-29 ENCOUNTER — Encounter: Payer: Self-pay | Admitting: Family Medicine

## 2021-06-29 ENCOUNTER — Ambulatory Visit (INDEPENDENT_AMBULATORY_CARE_PROVIDER_SITE_OTHER): Payer: PPO | Admitting: Family Medicine

## 2021-06-29 VITALS — BP 132/90 | HR 63 | Temp 97.8°F | Resp 18 | Ht 66.0 in | Wt 167.6 lb

## 2021-06-29 DIAGNOSIS — H6502 Acute serous otitis media, left ear: Secondary | ICD-10-CM

## 2021-06-29 MED ORDER — FLUTICASONE PROPIONATE 50 MCG/ACT NA SUSP: 2.0000 | Freq: Every day | NASAL | 6 refills | Status: DC

## 2021-06-29 NOTE — Progress Notes (Signed)
Subjective:   By signing my name below, I, Shehryar Baig, attest that this documentation has been prepared under the direction and in the presence of Ann Held, DO  06/29/2021     Patient ID: Sherry Ross, female    DOB: September 15, 1955, 66 y.o.   MRN: [id number]  Chief Complaint  Patient presents with   Tinnitus    Left ear, started over the weekend    HPI Patient is in today for a office visit.   She complains of hearing "cricket" like noises in her left ear since this past weekend. She denies having any congestion, hearing loss, dizziness or ear fullness. She is not taking any allergy medication at this time.    Past Medical History:  Diagnosis Date   Hyperlipidemia    Hypertension     Past Surgical History:  Procedure Laterality Date   ABDOMINAL HYSTERECTOMY  1995   CHOLECYSTECTOMY  1999   [facility]      Family History  Problem Relation Age of Onset   Alcohol abuse Father    Uterine cancer Mother    Breast cancer Mother    Heart disease Brother    Hypertension Brother    Diabetes Brother     Social History   Socioeconomic History   Marital status: Married    Spouse name: Not on file   Number of children: Not on file   Years of education: Not on file   Highest education level: Not on file  Occupational History   Occupation: medical billing    Comment: retired  Tobacco Use   Smoking status: Former    Packs/day: 0.80    Years: 4.00    Pack years: 3.20    Types: Cigarettes    Quit date: 02/15/1983    Years since quitting: 38.4   Smokeless tobacco: Never  Substance and Sexual Activity   Alcohol use: Yes   Drug use: No   Sexual activity: Not Currently  Other Topics Concern   Not on file  Social History Narrative   Not on file   Social Determinants of Health   Financial Resource Strain: Low Risk    Difficulty of Paying Living Expenses: Not hard at all  Food Insecurity: No Food Insecurity   Worried About Charity fundraiser in the Last  Year: Never true   [place] in the Last Year: Never true  Transportation Needs: No Transportation Needs   Lack of Transportation (Medical): No   Lack of Transportation (Non-Medical): No  Physical Activity: Sufficiently Active   Days of Exercise per Week: 4 days   Minutes of Exercise per Session: 60 min  Stress: No Stress Concern Present   Feeling of Stress : Not at all  Social Connections: Socially Integrated   Frequency of Communication with Friends and Family: More than three times a week   Frequency of Social Gatherings with Friends and Family: More than three times a week   Attends Religious Services: More than 4 times per year   Active Member of [employer] or Organizations: Yes   Attends Music therapist: More than 4 times per year   Marital Status: Married  Human resources officer Violence: Not At Risk   Fear of Current or Ex-Partner: No   Emotionally Abused: No   Physically Abused: No   Sexually Abused: No    Outpatient Medications Prior to Visit  Medication Sig Dispense Refill   rosuvastatin (CRESTOR) 10 MG tablet TAKE 1 TABLET BY  MOUTH EVERY OTHER DAY 45 tablet 7   nitrofurantoin, macrocrystal-monohydrate, (MACROBID) 100 MG capsule Take 1 capsule (100 mg total) by mouth 2 (two) times daily. (Patient not taking: Reported on 06/29/2021) 14 capsule 0   No facility-administered medications prior to visit.    No Known Allergies  Review of Systems  HENT:  Negative for congestion and hearing loss.        (+)hearing cricket noises in left ear   Neurological:  Negative for dizziness.      Objective:    Physical Exam Constitutional:      General: She is not in acute distress.    Appearance: Normal appearance. She is not ill-appearing.  HENT:     Head: Normocephalic and atraumatic.     Right Ear: External ear normal.     Left Ear: External ear normal.  Eyes:     Extraocular Movements: Extraocular movements intact.     Pupils: Pupils are equal, round, and  reactive to light.  Cardiovascular:     Rate and Rhythm: Normal rate and regular rhythm.     Heart sounds: Normal heart sounds. No murmur heard.   No gallop.  Pulmonary:     Effort: Pulmonary effort is normal. No respiratory distress.     Breath sounds: Normal breath sounds. No wheezing or rales.  Skin:    General: Skin is warm and dry.  Neurological:     Mental Status: She is alert and oriented to person, place, and time.  Psychiatric:        Judgment: Judgment normal.    BP 132/90 (BP Location: Right Arm, Patient Position: Sitting, Cuff Size: Normal)   Pulse 63   Temp 97.8 F (36.6 C) (Oral)   Resp 18   Ht '5\' 6"'$  (1.676 m)   Wt 167 lb 9.6 oz (76 kg)   LMP 02/14/1993   SpO2 97%   BMI 27.05 kg/m  Wt Readings from Last 3 Encounters:  06/29/21 167 lb 9.6 oz (76 kg)  02/26/21 169 lb 12.8 oz (77 kg)  02/22/21 167 lb (75.8 kg)    Diabetic Foot Exam - Simple   No data filed    Lab Results  Component Value Date   WBC 5.1 06/20/2017   HGB 14.1 06/20/2017   HCT 40.5 06/20/2017   PLT 201.0 06/20/2017   GLUCOSE 101 (H) 02/25/2021   CHOL 235 (H) 02/25/2021   TRIG 117.0 02/25/2021   HDL 49.30 02/25/2021   LDLDIRECT 142.0 06/20/2017   LDLCALC 163 (H) 02/25/2021   ALT 20 02/25/2021   AST 22 02/25/2021   NA 140 02/25/2021   K 4.4 02/25/2021   CL 104 02/25/2021   CREATININE 0.63 02/25/2021   BUN 12 02/25/2021   CO2 27 02/25/2021   TSH 1.22 06/20/2017   HGBA1C 5.9 07/18/2018    Lab Results  Component Value Date   TSH 1.22 06/20/2017   Lab Results  Component Value Date   WBC 5.1 06/20/2017   HGB 14.1 06/20/2017   HCT 40.5 06/20/2017   MCV 89.7 06/20/2017   PLT 201.0 06/20/2017   Lab Results  Component Value Date   NA 140 02/25/2021   K 4.4 02/25/2021   CO2 27 02/25/2021   GLUCOSE 101 (H) 02/25/2021   BUN 12 02/25/2021   CREATININE 0.63 02/25/2021   BILITOT 0.7 02/25/2021   ALKPHOS 58 02/25/2021   AST 22 02/25/2021   ALT 20 02/25/2021   PROT 6.6  02/25/2021   ALBUMIN 4.4 02/25/2021  CALCIUM 8.8 02/25/2021   ANIONGAP 13 09/30/2014   GFR 92.37 02/25/2021   Lab Results  Component Value Date   CHOL 235 (H) 02/25/2021   Lab Results  Component Value Date   HDL 49.30 02/25/2021   Lab Results  Component Value Date   LDLCALC 163 (H) 02/25/2021   Lab Results  Component Value Date   TRIG 117.0 02/25/2021   Lab Results  Component Value Date   CHOLHDL 5 02/25/2021   Lab Results  Component Value Date   HGBA1C 5.9 07/18/2018       Assessment & Plan:   Problem List Items Addressed This Visit       Unprioritized   Non-recurrent acute serous otitis media of left ear - Primary    flonase , antihistamine  Consider astepro  ent if no better        Relevant Medications   fluticasone (FLONASE) 50 MCG/ACT nasal spray     Meds ordered this encounter  Medications   fluticasone (FLONASE) 50 MCG/ACT nasal spray    Sig: Place 2 sprays into both nostrils daily.    Dispense:  16 g    Refill:  6    I, Ann Held, DO, personally preformed the services described in this documentation.  All medical record entries made by the scribe were at my direction and in my presence.  I have reviewed the chart and discharge instructions (if applicable) and agree that the record reflects my personal performance and is accurate and complete. 06/29/2021   I,Shehryar Baig,acting as a scribe for Ann Held, DO.,have documented all relevant documentation on the behalf of Ann Held, DO,as directed by  Ann Held, DO while in the presence of Ann Held, DO.   Ann Held, DO

## 2021-06-29 NOTE — Patient Instructions (Signed)
Tinnitus ?Tinnitus refers to hearing a sound when there is no actual source for that sound. This is often described as ringing in the ears. However, people with this condition may hear a variety of noises, in one ear or in both ears. ?The sounds of tinnitus can be soft, loud, or somewhere in between. Tinnitus can last for a few seconds or can be constant for days. It may go away without treatment and come back at various times. When tinnitus is constant or happens often, it can lead to other problems, such as trouble sleeping and trouble concentrating. ?Almost everyone experiences tinnitus at some point. Tinnitus is not the same as hearing loss. Tinnitus that is long-lasting (chronic) or comes back often (recurs) may require medical attention. ?What are the causes? ?The cause of tinnitus is often not known. In some cases, it can result from: ?Exposure to loud noises from machinery, music, or other sources. ?An object (foreign body) stuck in the ear. ?Earwax buildup. ?Drinking alcohol or caffeine. ?Taking certain medicines. ?Age-related hearing loss. ?It may also be caused by medical conditions such as: ?Ear or sinus infections. ?Heart diseases or high blood pressure. ?Allergies. ?M?ni?re's disease. ?Thyroid problems. ?Tumors. ?A weak, bulging blood vessel (aneurysm) near the ear. ?What increases the risk? ?The following factors may make you more likely to develop this condition: ?Exposure to loud noises. ?Age. Tinnitus is more likely in older individuals. ?Using alcohol or tobacco. ?What are the signs or symptoms? ?The main symptom of tinnitus is hearing a sound when there is no source for that sound. It may sound like: ?Buzzing. ?Sizzling. ?Ringing. ?Blowing air. ?Hissing. ?Whistling. ?Other sounds may include: ?Roaring. ?Running water. ?A musical note. ?Tapping. ?Humming. ?Symptoms may affect only one ear (unilateral) or both ears (bilateral). ?How is this diagnosed? ?Tinnitus is diagnosed based on your symptoms,  your medical history, and a physical exam. Your health care provider may do a thorough hearing test (audiologic exam) if your tinnitus: ?Is unilateral. ?Causes hearing difficulties. ?Lasts 6 months or longer. ?You may work with a health care provider who specializes in hearing disorders (audiologist). You may be asked questions about your symptoms and how they affect your daily life. You may have other tests done, such as: ?CT scan. ?MRI. ?An imaging test of how blood flows through your blood vessels (angiogram). ?How is this treated? ?Treating an underlying medical condition can sometimes make tinnitus go away. If your tinnitus continues, other treatments may include: ?Therapy and counseling to help you manage the stress of living with tinnitus. ?Sound generators to mask the tinnitus. These include: ?Tabletop sound machines that play relaxing sounds to help you fall asleep. ?Wearable devices that fit in your ear and play sounds or music. ?Acoustic neural stimulation. This involves using headphones to listen to music that contains an auditory signal. Over time, listening to this signal may change some pathways in your brain and make you less sensitive to tinnitus. This treatment is used for very severe cases when no other treatment is working. ?Using hearing aids or cochlear implants if your tinnitus is related to hearing loss. Hearing aids are worn in the outer ear. Cochlear implants are surgically placed in the inner ear. ?Follow these instructions at home: ?Managing symptoms ? ?  ? ?When possible, avoid being in loud places and being exposed to loud sounds. ?Wear hearing protection, such as earplugs, when you are exposed to loud noises. ?Use a white noise machine, a humidifier, or other devices to mask the sound of tinnitus. ?  Practice techniques for reducing stress, such as meditation, yoga, or deep breathing. Work with your health care provider if you need help with managing stress. ?Sleep with your head  slightly raised. This may reduce the impact of tinnitus. ?General instructions ?Do not use stimulants, such as nicotine, alcohol, or caffeine. Talk with your health care provider about other stimulants to avoid. Stimulants are substances that can make you feel alert and attentive by increasing certain activities in the body (such as heart rate and blood pressure). These substances may make tinnitus worse. ?Take over-the-counter and prescription medicines only as told by your health care provider. ?Try to get plenty of sleep each night. ?Keep all follow-up visits. This is important. ?Contact a health care provider if: ?Your tinnitus continues for 3 weeks or longer without stopping. ?You develop sudden hearing loss. ?Your symptoms get worse or do not get better with home care. ?You feel you are not able to manage the stress of living with tinnitus. ?Get help right away if: ?You develop tinnitus after a head injury. ?You have tinnitus along with any of the following: ?Dizziness. ?Nausea and vomiting. ?Loss of balance. ?Sudden, severe headache. ?Vision changes. ?Facial weakness or weakness of arms or legs. ?These symptoms may represent a serious problem that is an emergency. Do not wait to see if the symptoms will go away. Get medical help right away. Call your local emergency services (911 in the [place] Do not drive yourself to the hospital. ?Summary ?Tinnitus refers to hearing a sound when there is no actual source for that sound. This is often described as ringing in the ears. ?Symptoms may affect only one ear (unilateral) or both ears (bilateral). ?Use a white noise machine, a humidifier, or other devices to mask the sound of tinnitus. ?Do not use stimulants, such as nicotine, alcohol, or caffeine. These substances may make tinnitus worse. ?This information is not intended to replace advice given to you by your health care provider. Make sure you discuss any questions you have with your health care  provider. ?Document Revised: 01/06/2020 Document Reviewed: 01/06/2020 ?Elsevier Patient Education ? 2023 Elsevier Inc. ? ?

## 2021-07-01 DIAGNOSIS — H6502 Acute serous otitis media, left ear: Secondary | ICD-10-CM | POA: Insufficient documentation

## 2021-07-01 NOTE — Assessment & Plan Note (Signed)
flonase , antihistamine  Consider astepro  ent if no better

## 2021-07-02 ENCOUNTER — Other Ambulatory Visit: Payer: Self-pay | Admitting: Family Medicine

## 2021-07-02 ENCOUNTER — Encounter: Payer: Self-pay | Admitting: Family Medicine

## 2021-07-02 DIAGNOSIS — H9319 Tinnitus, unspecified ear: Secondary | ICD-10-CM

## 2021-07-02 NOTE — Telephone Encounter (Signed)
Pt seen on 06/29/21 for this concern. Please advise

## 2021-07-24 ENCOUNTER — Ambulatory Visit
Admission: RE | Admit: 2021-07-24 | Discharge: 2021-07-24 | Disposition: A | Discharge status: Home / Self Care | Payer: PPO | Source: Ambulatory Visit | Attending: Family Medicine | Admitting: Family Medicine

## 2021-07-24 DIAGNOSIS — H9319 Tinnitus, unspecified ear: Secondary | ICD-10-CM

## 2021-07-24 DIAGNOSIS — H9313 Tinnitus, bilateral: Secondary | ICD-10-CM | POA: Diagnosis not present

## 2021-07-29 ENCOUNTER — Telehealth (INDEPENDENT_AMBULATORY_CARE_PROVIDER_SITE_OTHER): Payer: PPO | Admitting: Family Medicine

## 2021-07-29 ENCOUNTER — Encounter: Payer: Self-pay | Admitting: Family Medicine

## 2021-07-29 DIAGNOSIS — H9319 Tinnitus, unspecified ear: Secondary | ICD-10-CM | POA: Diagnosis not present

## 2021-07-29 NOTE — Progress Notes (Signed)
MyChart Video Visit    Virtual Visit via Video Note   This visit type was conducted due to national recommendations for restrictions regarding the COVID-19 Pandemic (e.g. social distancing) in an effort to limit this patient's exposure and mitigate transmission in our community. This patient is at least at moderate risk for complications without adequate follow up. This format is felt to be most appropriate for this patient at this time. Physical exam was limited by quality of the video and audio technology used for the visit. [name]  was able to get the patient set up on a video visit.  Patient location: Home Patient and provider in visit Provider location: Office  I discussed the limitations of evaluation and management by telemedicine and the availability of in person appointments. The patient expressed understanding and agreed to proceed.  Visit Date: 07/29/2021  Today's healthcare provider: Ann Held, DO     Subjective:    Patient ID: Sherry Ross, female    DOB: 1955/05/02, 66 y.o.   MRN: [id number]  Chief Complaint  Patient presents with   MRI Results    HPI Patient is in today for a video visit.   She continues having ringing in her ears and reports no changes since her last visit. She is managing it on her own. She reports completing her MR for her ears. She has an upcomming appointment with an ENT specialist follow her scans.    Past Medical History:  Diagnosis Date   Hyperlipidemia    Hypertension     Past Surgical History:  Procedure Laterality Date   ABDOMINAL HYSTERECTOMY  1995   CHOLECYSTECTOMY  1999   [facility]      Family History  Problem Relation Age of Onset   Alcohol abuse Father    Uterine cancer Mother    Breast cancer Mother    Heart disease Brother    Hypertension Brother    Diabetes Brother     Social History   Socioeconomic History   Marital status: Married    Spouse name: Not on file   Number of children: Not on  file   Years of education: Not on file   Highest education level: Not on file  Occupational History   Occupation: medical billing    Comment: retired  Tobacco Use   Smoking status: Former    Packs/day: 0.80    Years: 4.00    Total pack years: 3.20    Types: Cigarettes    Quit date: 02/15/1983    Years since quitting: 38.4   Smokeless tobacco: Never  Substance and Sexual Activity   Alcohol use: Yes   Drug use: No   Sexual activity: Not Currently  Other Topics Concern   Not on file  Social History Narrative   Not on file   Social Determinants of Health   Financial Resource Strain: Low Risk  (02/22/2021)   Overall Financial Resource Strain (CARDIA)    Difficulty of Paying Living Expenses: Not hard at all  Food Insecurity: No Food Insecurity (02/22/2021)   Hunger Vital Sign    Worried About Running Out of Food in the Last Year: Never true    [place] in the Last Year: Never true  Transportation Needs: No Transportation Needs (02/22/2021)   PRAPARE - Hydrologist (Medical): No    Lack of Transportation (Non-Medical): No  Physical Activity: Sufficiently Active (02/22/2021)   Exercise Vital Sign    Days of  Exercise per Week: 4 days    Minutes of Exercise per Session: 60 min  Stress: No Stress Concern Present (02/22/2021)   [place]    Feeling of Stress : Not at all  Social Connections: [place] (02/22/2021)   Social Connection and Isolation Panel [NHANES]    Frequency of Communication with Friends and Family: More than three times a week    Frequency of Social Gatherings with Friends and Family: More than three times a week    Attends Religious Services: More than 4 times per year    Active Member of [employer] or Organizations: Yes    Attends Music therapist: More than 4 times per year    Marital Status: Married  Human resources officer Violence: Not At Risk (02/22/2021)    Humiliation, Afraid, Rape, and Kick questionnaire    Fear of Current or Ex-Partner: No    Emotionally Abused: No    Physically Abused: No    Sexually Abused: No    Outpatient Medications Prior to Visit  Medication Sig Dispense Refill   rosuvastatin (CRESTOR) 10 MG tablet TAKE 1 TABLET BY MOUTH EVERY OTHER DAY 45 tablet 7   fluticasone (FLONASE) 50 MCG/ACT nasal spray Place 2 sprays into both nostrils daily. 16 g 6   No facility-administered medications prior to visit.    No Known Allergies  Review of Systems  Constitutional:  Negative for fever and malaise/fatigue.  HENT:  Positive for tinnitus. Negative for congestion.   Eyes:  Negative for blurred vision.  Respiratory:  Negative for shortness of breath.   Cardiovascular:  Negative for chest pain, palpitations and leg swelling.  Gastrointestinal:  Negative for abdominal pain, blood in stool and nausea.  Genitourinary:  Negative for dysuria and frequency.  Musculoskeletal:  Negative for falls.  Skin:  Negative for rash.  Neurological:  Negative for dizziness, loss of consciousness and headaches.  Endo/Heme/Allergies:  Negative for environmental allergies.  Psychiatric/Behavioral:  Negative for depression. The patient is not nervous/anxious.        Objective:    Physical Exam Vitals and nursing note reviewed.  Psychiatric:        Mood and Affect: Mood normal.     LMP 02/14/1993  Wt Readings from Last 3 Encounters:  06/29/21 167 lb 9.6 oz (76 kg)  02/26/21 169 lb 12.8 oz (77 kg)  02/22/21 167 lb (75.8 kg)    Diabetic Foot Exam - Simple   No data filed    Lab Results  Component Value Date   WBC 5.1 06/20/2017   HGB 14.1 06/20/2017   HCT 40.5 06/20/2017   PLT 201.0 06/20/2017   GLUCOSE 101 (H) 02/25/2021   CHOL 235 (H) 02/25/2021   TRIG 117.0 02/25/2021   HDL 49.30 02/25/2021   LDLDIRECT 142.0 06/20/2017   LDLCALC 163 (H) 02/25/2021   ALT 20 02/25/2021   AST 22 02/25/2021   NA 140 02/25/2021   K 4.4  02/25/2021   CL 104 02/25/2021   CREATININE 0.63 02/25/2021   BUN 12 02/25/2021   CO2 27 02/25/2021   TSH 1.22 06/20/2017   HGBA1C 5.9 07/18/2018    Lab Results  Component Value Date   TSH 1.22 06/20/2017   Lab Results  Component Value Date   WBC 5.1 06/20/2017   HGB 14.1 06/20/2017   HCT 40.5 06/20/2017   MCV 89.7 06/20/2017   PLT 201.0 06/20/2017   Lab Results  Component Value Date   NA  140 02/25/2021   K 4.4 02/25/2021   CO2 27 02/25/2021   GLUCOSE 101 (H) 02/25/2021   BUN 12 02/25/2021   CREATININE 0.63 02/25/2021   BILITOT 0.7 02/25/2021   ALKPHOS 58 02/25/2021   AST 22 02/25/2021   ALT 20 02/25/2021   PROT 6.6 02/25/2021   ALBUMIN 4.4 02/25/2021   CALCIUM 8.8 02/25/2021   ANIONGAP 13 09/30/2014   GFR 92.37 02/25/2021   Lab Results  Component Value Date   CHOL 235 (H) 02/25/2021   Lab Results  Component Value Date   HDL 49.30 02/25/2021   Lab Results  Component Value Date   LDLCALC 163 (H) 02/25/2021   Lab Results  Component Value Date   TRIG 117.0 02/25/2021   Lab Results  Component Value Date   CHOLHDL 5 02/25/2021   Lab Results  Component Value Date   HGBA1C 5.9 07/18/2018       Assessment & Plan:   Problem List Items Addressed This Visit   None    No orders of the defined types were placed in this encounter.   I discussed the assessment and treatment plan with the patient. The patient was provided an opportunity to ask questions and all were answered. The patient agreed with the plan and demonstrated an understanding of the instructions.   The patient was advised to call back or seek an in-person evaluation if the symptoms worsen or if the condition fails to improve as anticipated.  I,Shehryar Baig,acting as a Education administrator for [employer], DO.,have documented all relevant documentation on the behalf of Ann Held, DO,as directed by  Ann Held, DO while in the presence of Ann Held, DO.  I  provided 20 minutes of face-to-face time during this encounter.   Ann Held, DO [place] at [facility] [phone] (phone) [phone] (fax)  [place]

## 2021-08-04 DIAGNOSIS — H9319 Tinnitus, unspecified ear: Secondary | ICD-10-CM | POA: Insufficient documentation

## 2021-08-04 NOTE — Assessment & Plan Note (Signed)
Mri normal  ent visit pending

## 2021-08-05 DIAGNOSIS — H903 Sensorineural hearing loss, bilateral: Secondary | ICD-10-CM | POA: Diagnosis not present

## 2021-08-05 DIAGNOSIS — H9312 Tinnitus, left ear: Secondary | ICD-10-CM | POA: Diagnosis not present

## 2021-08-05 DIAGNOSIS — Z8673 Personal history of transient ischemic attack (TIA), and cerebral infarction without residual deficits: Secondary | ICD-10-CM | POA: Diagnosis not present

## 2021-11-23 DIAGNOSIS — D485 Neoplasm of uncertain behavior of skin: Secondary | ICD-10-CM | POA: Diagnosis not present

## 2021-11-23 DIAGNOSIS — L814 Other melanin hyperpigmentation: Secondary | ICD-10-CM | POA: Diagnosis not present

## 2021-11-23 DIAGNOSIS — E663 Overweight: Secondary | ICD-10-CM | POA: Diagnosis not present

## 2021-11-23 DIAGNOSIS — L821 Other seborrheic keratosis: Secondary | ICD-10-CM | POA: Diagnosis not present

## 2021-11-23 DIAGNOSIS — X32XXXS Exposure to sunlight, sequela: Secondary | ICD-10-CM | POA: Diagnosis not present

## 2021-11-23 DIAGNOSIS — D1801 Hemangioma of skin and subcutaneous tissue: Secondary | ICD-10-CM | POA: Diagnosis not present

## 2021-12-13 DIAGNOSIS — M25562 Pain in left knee: Secondary | ICD-10-CM | POA: Diagnosis not present

## 2022-01-02 ENCOUNTER — Other Ambulatory Visit: Payer: Self-pay | Admitting: Family Medicine

## 2022-01-02 DIAGNOSIS — H6502 Acute serous otitis media, left ear: Secondary | ICD-10-CM

## 2022-01-04 ENCOUNTER — Encounter: Payer: Self-pay | Admitting: Family Medicine

## 2022-01-14 ENCOUNTER — Telehealth (INDEPENDENT_AMBULATORY_CARE_PROVIDER_SITE_OTHER): Payer: PPO | Admitting: Family Medicine

## 2022-01-14 ENCOUNTER — Encounter: Payer: Self-pay | Admitting: Family Medicine

## 2022-01-14 DIAGNOSIS — K580 Irritable bowel syndrome with diarrhea: Secondary | ICD-10-CM | POA: Insufficient documentation

## 2022-01-14 NOTE — Progress Notes (Signed)
MyChart Video Visit    Virtual Visit via Video Note   This visit type was conducted due to national recommendations for restrictions regarding the COVID-19 Pandemic (e.g. social distancing) in an effort to limit this patient's exposure and mitigate transmission in our community. This patient is at least at moderate risk for complications without adequate follow up. This format is felt to be most appropriate for this patient at this time. Physical exam was limited by quality of the video and audio technology used for the visit. [name] was able to get the patient set up on a video visit.  Patient location: Home Patient and provider in visit Provider location: Office  I discussed the limitations of evaluation and management by telemedicine and the availability of in person appointments. The patient expressed understanding and agreed to proceed.  Visit Date: 01/14/2022  Today's healthcare provider: Ann Held, DO     Subjective:    Patient ID: Sherry Ross, female    DOB: 11-19-1955, 66 y.o.   MRN: [id number]  No chief complaint on file.   HPI Patient is in today for a virtual office visit  She states that her IBS symptoms are persistent. She states she rarely goes but when she does go, she has diarrhea and about once a month, she does not make it to the restroom. She has been tested for celiac and has had multiple colonoscopy's. She has spoke to a previous friend during the summer who was put on Xifaxan and states that it improved her symptom. She is inquiring about the possibility of Xifaxan for herself. She reports that her last colonoscopy was in 08/29/2019. She occasionally takes two Imodium during the onset of her IBS symptoms and that usually improves her symptoms. However, she does not take a significant amount because it causes her some constipation. She reports that due to her intermittent fasting, she is producing a bowel movement about weekly.   She reports that  she is UTD on her RSV and influenza vaccine that she received on 10/15/2021.  Past Medical History:  Diagnosis Date   Hyperlipidemia    Hypertension     Past Surgical History:  Procedure Laterality Date   ABDOMINAL HYSTERECTOMY  1995   CHOLECYSTECTOMY  1999   [facility]      Family History  Problem Relation Age of Onset   Alcohol abuse Father    Uterine cancer Mother    Breast cancer Mother    Heart disease Brother    Hypertension Brother    Diabetes Brother     Social History   Socioeconomic History   Marital status: Married    Spouse name: Not on file   Number of children: Not on file   Years of education: Not on file   Highest education level: Not on file  Occupational History   Occupation: medical billing    Comment: retired  Tobacco Use   Smoking status: Former    Packs/day: 0.80    Years: 4.00    Total pack years: 3.20    Types: Cigarettes    Quit date: 02/15/1983    Years since quitting: 38.9   Smokeless tobacco: Never  Substance and Sexual Activity   Alcohol use: Yes   Drug use: No   Sexual activity: Not Currently  Other Topics Concern   Not on file  Social History Narrative   Not on file   Social Determinants of Health   Financial Resource Strain: Low Risk  (02/22/2021)  Overall Financial Resource Strain (CARDIA)    Difficulty of Paying Living Expenses: Not hard at all  Food Insecurity: No Food Insecurity (02/22/2021)   Hunger Vital Sign    Worried About Running Out of Food in the Last Year: Never true    Ran Out of Food in the Last Year: Never true  Transportation Needs: No Transportation Needs (02/22/2021)   PRAPARE - Hydrologist (Medical): No    Lack of Transportation (Non-Medical): No  Physical Activity: Sufficiently Active (02/22/2021)   Exercise Vital Sign    Days of Exercise per Week: 4 days    Minutes of Exercise per Session: 60 min  Stress: No Stress Concern Present (02/22/2021)   [place]    Feeling of Stress : Not at all  Social Connections: [place] (02/22/2021)   Social Connection and Isolation Panel [NHANES]    Frequency of Communication with Friends and Family: More than three times a week    Frequency of Social Gatherings with Friends and Family: More than three times a week    Attends Religious Services: More than 4 times per year    Active Member of [employer] or Organizations: Yes    Attends Music therapist: More than 4 times per year    Marital Status: Married  Human resources officer Violence: Not At Risk (02/22/2021)   Humiliation, Afraid, Rape, and Kick questionnaire    Fear of Current or Ex-Partner: No    Emotionally Abused: No    Physically Abused: No    Sexually Abused: No    Outpatient Medications Prior to Visit  Medication Sig Dispense Refill   rosuvastatin (CRESTOR) 10 MG tablet TAKE 1 TABLET BY MOUTH EVERY OTHER DAY 45 tablet 7   No facility-administered medications prior to visit.    No Known Allergies  Review of Systems  Constitutional:  Negative for chills, fever and malaise/fatigue.  HENT:  Negative for congestion and hearing loss.   Eyes:  Negative for discharge.  Respiratory:  Negative for cough, sputum production and shortness of breath.   Cardiovascular:  Negative for chest pain, palpitations and leg swelling.  Gastrointestinal:  Negative for abdominal pain, blood in stool, constipation, diarrhea, heartburn, nausea and vomiting.  Genitourinary:  Negative for dysuria, frequency, hematuria and urgency.  Musculoskeletal:  Negative for back pain, falls and myalgias.  Skin:  Negative for rash.  Neurological:  Negative for dizziness, sensory change, loss of consciousness, weakness and headaches.  Endo/Heme/Allergies:  Negative for environmental allergies. Does not bruise/bleed easily.  Psychiatric/Behavioral:  Negative for depression and suicidal ideas. The patient is not nervous/anxious  and does not have insomnia.        Objective:    Physical Exam Vitals and nursing note reviewed.  Constitutional:      Appearance: She is well-developed.  HENT:     Head: Normocephalic and atraumatic.  Eyes:     Conjunctiva/sclera: Conjunctivae normal.  Neck:     Thyroid: No thyromegaly.     Vascular: No carotid bruit or JVD.  Cardiovascular:     Rate and Rhythm: Normal rate and regular rhythm.     Heart sounds: Normal heart sounds. No murmur heard. Pulmonary:     Effort: Pulmonary effort is normal. No respiratory distress.     Breath sounds: Normal breath sounds. No wheezing or rales.  Chest:     Chest wall: No tenderness.  Musculoskeletal:     Cervical back: Normal  range of motion and neck supple.  Neurological:     Mental Status: She is alert and oriented to person, place, and time.     LMP 02/14/1993  Wt Readings from Last 3 Encounters:  06/29/21 167 lb 9.6 oz (76 kg)  02/26/21 169 lb 12.8 oz (77 kg)  02/22/21 167 lb (75.8 kg)    Diabetic Foot Exam - Simple   No data filed    Lab Results  Component Value Date   WBC 5.1 06/20/2017   HGB 14.1 06/20/2017   HCT 40.5 06/20/2017   PLT 201.0 06/20/2017   GLUCOSE 101 (H) 02/25/2021   CHOL 235 (H) 02/25/2021   TRIG 117.0 02/25/2021   HDL 49.30 02/25/2021   LDLDIRECT 142.0 06/20/2017   LDLCALC 163 (H) 02/25/2021   ALT 20 02/25/2021   AST 22 02/25/2021   NA 140 02/25/2021   K 4.4 02/25/2021   CL 104 02/25/2021   CREATININE 0.63 02/25/2021   BUN 12 02/25/2021   CO2 27 02/25/2021   TSH 1.22 06/20/2017   HGBA1C 5.9 07/18/2018    Lab Results  Component Value Date   TSH 1.22 06/20/2017   Lab Results  Component Value Date   WBC 5.1 06/20/2017   HGB 14.1 06/20/2017   HCT 40.5 06/20/2017   MCV 89.7 06/20/2017   PLT 201.0 06/20/2017   Lab Results  Component Value Date   NA 140 02/25/2021   K 4.4 02/25/2021   CO2 27 02/25/2021   GLUCOSE 101 (H) 02/25/2021   BUN 12 02/25/2021   CREATININE 0.63  02/25/2021   BILITOT 0.7 02/25/2021   ALKPHOS 58 02/25/2021   AST 22 02/25/2021   ALT 20 02/25/2021   PROT 6.6 02/25/2021   ALBUMIN 4.4 02/25/2021   CALCIUM 8.8 02/25/2021   ANIONGAP 13 09/30/2014   GFR 92.37 02/25/2021   Lab Results  Component Value Date   CHOL 235 (H) 02/25/2021   Lab Results  Component Value Date   HDL 49.30 02/25/2021   Lab Results  Component Value Date   LDLCALC 163 (H) 02/25/2021   Lab Results  Component Value Date   TRIG 117.0 02/25/2021   Lab Results  Component Value Date   CHOLHDL 5 02/25/2021   Lab Results  Component Value Date   HGBA1C 5.9 07/18/2018       Assessment & Plan:   Problem List Items Addressed This Visit       Unprioritized   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   I discussed the assessment and treatment plan with the patient. The patient was provided an opportunity to ask questions and all were answered. The patient agreed with the plan and demonstrated an understanding of the instructions.   The patient was advised to call back or seek an in-person evaluation if the symptoms worsen or if the condition fails to improve as anticipated.  I provided 20 minutes of face-to-face time during this encounter.   I,Amber Collins,acting as a Education administrator for [employer], DO.,have documented all relevant documentation on the behalf of Ann Held, DO,as directed by  Ann Held, DO while in the presence of Ann Held, DO.   Ann Held,  DO [employer] at [place] (phone) [phone] (fax)  [place]

## 2022-01-14 NOTE — Assessment & Plan Note (Signed)
Refer back to her GI She was requesting xifaxin She will use immodium until then

## 2022-01-14 NOTE — Patient Instructions (Signed)
Diet for Irritable Bowel Syndrome When you have irritable bowel syndrome (IBS), it is very important to follow the eating habits that are best for your condition. IBS may cause various symptoms, such as pain in the abdomen, constipation, or diarrhea. Choosing the right foods can help to ease the discomfort from these symptoms. Work with your health care provider and dietitian to find the eating plan that will help to control your symptoms. What are tips for following this plan?  Keep a food diary. This will help you identify foods that cause symptoms. Write down: What you eat and when you eat it. What symptoms you have. When symptoms occur in relation to your meals, such as "pain in abdomen 2 hours after dinner." Eat your meals slowly and in a relaxed setting. Aim to eat 5-6 small meals per day. Do not skip meals. Drink enough fluid to keep your urine pale yellow. Ask your health care provider if you should take an over-the-counter probiotic to help restore healthy bacteria in your gut (digestive tract). Probiotics are foods that contain good bacteria and yeasts. Your dietitian may have specific dietary recommendations for you based on your symptoms. Your dietitian may recommend that you: Avoid foods that cause symptoms. Talk with your dietitian about other ways to get the same nutrients that are in those problem foods. Avoid foods with gluten. Gluten is a protein that is found in rye, wheat, and barley. Eat more foods that contain soluble fiber. Examples of foods with high soluble fiber include oats, seeds, and certain fruits and vegetables. Take a fiber supplement if told by your dietitian. Reduce or avoid certain foods called FODMAPs. These are foods that contain sugars that are hard for some people to digest. Ask your health care provider which foods to avoid. What foods should I avoid? The following are some foods and drinks that may make your symptoms worse: Fatty foods, such as french  fries. Foods that contain gluten, such as pasta and cereal. Dairy products, such as milk, cheese, and ice cream. Spicy foods. Alcohol. Products with caffeine, such as coffee, tea, or chocolate. Carbonated drinks, such as soda. Foods that are high in FODMAPs. These include certain fruits and vegetables. Products with sweeteners such as honey, high fructose corn syrup, sorbitol, and mannitol. The items listed above may not be a complete list of foods and beverages you should avoid. Contact a dietitian for more information. What foods are good sources of fiber? Your health care provider or dietitian may recommend that you eat more foods that contain fiber. Fiber can help to reduce constipation and other IBS symptoms. Add foods with fiber to your diet a little at a time so your body can get used to them. Too much fiber at one time might cause gas and swelling of your abdomen. The following are some foods that are good sources of fiber: Berries, such as raspberries, strawberries, and blueberries. Tomatoes. Carrots. Brown rice. Oats. Seeds, such as chia and pumpkin seeds. The items listed above may not be a complete list of recommended sources of fiber. Contact your dietitian for more options. Where to find more information International Foundation for Functional Gastrointestinal Disorders: aboutibs.org National Institute of Diabetes and Digestive and Kidney Diseases: niddk.nih.gov Summary When you have irritable bowel syndrome (IBS), it is very important to follow the eating habits that are best for your condition. IBS may cause various symptoms, such as pain in the abdomen, constipation, or diarrhea. Choosing the right foods can help to ease the   discomfort that comes from symptoms. Your health care provider or dietitian may recommend that you eat more foods that contain fiber. Keep a food diary. This will help you identify foods that cause symptoms. This information is not intended to replace  advice given to you by your health care provider. Make sure you discuss any questions you have with your health care provider. Document Revised: 01/12/2021 Document Reviewed: 01/12/2021 Elsevier Patient Education  2023 Elsevier Inc.  

## 2022-01-25 DIAGNOSIS — K580 Irritable bowel syndrome with diarrhea: Secondary | ICD-10-CM | POA: Diagnosis not present

## 2022-02-14 DIAGNOSIS — R69 Illness, unspecified: Secondary | ICD-10-CM | POA: Diagnosis not present

## 2022-02-17 ENCOUNTER — Other Ambulatory Visit: Payer: Self-pay | Admitting: Family Medicine

## 2022-02-17 DIAGNOSIS — H6502 Acute serous otitis media, left ear: Secondary | ICD-10-CM

## 2022-02-25 ENCOUNTER — Ambulatory Visit: Payer: PPO

## 2022-04-17 ENCOUNTER — Other Ambulatory Visit: Payer: Self-pay | Admitting: Family Medicine

## 2022-04-17 DIAGNOSIS — H6502 Acute serous otitis media, left ear: Secondary | ICD-10-CM

## 2022-04-19 ENCOUNTER — Encounter: Payer: Self-pay | Admitting: Family Medicine

## 2022-04-19 DIAGNOSIS — E785 Hyperlipidemia, unspecified: Secondary | ICD-10-CM

## 2022-04-19 MED ORDER — ROSUVASTATIN CALCIUM 10 MG PO TABS: 10.0000 mg | ORAL_TABLET | ORAL | 0 refills | Status: DC

## 2022-04-25 ENCOUNTER — Telehealth: Payer: Self-pay | Admitting: Family Medicine

## 2022-04-25 NOTE — Telephone Encounter (Signed)
Contacted Colleena Barlas to schedule their annual wellness visit. Appointment made for 05/04/2022.  Sherol Dade; [facility] Direct Dial: [phone]

## 2022-04-28 DIAGNOSIS — R69 Illness, unspecified: Secondary | ICD-10-CM | POA: Diagnosis not present

## 2022-05-03 DIAGNOSIS — R69 Illness, unspecified: Secondary | ICD-10-CM | POA: Diagnosis not present

## 2022-06-02 DIAGNOSIS — Z1231 Encounter for screening mammogram for malignant neoplasm of breast: Secondary | ICD-10-CM | POA: Diagnosis not present

## 2022-06-02 LAB — HM MAMMOGRAPHY

## 2022-06-03 ENCOUNTER — Ambulatory Visit (INDEPENDENT_AMBULATORY_CARE_PROVIDER_SITE_OTHER): Payer: Medicare HMO | Admitting: *Deleted

## 2022-06-03 DIAGNOSIS — Z0000 Encounter for general adult medical examination without abnormal findings: Secondary | ICD-10-CM | POA: Diagnosis not present

## 2022-06-03 NOTE — Patient Instructions (Signed)
Sherry Ross , Thank you for taking time to come for your Medicare Wellness Visit. I appreciate your ongoing commitment to your health goals. Please review the following plan we discussed and let me know if I can assist you in the future.     This is a list of the screening recommended for you and due dates:  Health Maintenance  Topic Date Due   COVID-19 Vaccine (1) Never done   Pneumonia Vaccine (1 of 1 - PCV) Never done   Colon Cancer Screening  11/20/2021   Mammogram  05/21/2022   Flu Shot  09/15/2022   DTaP/Tdap/Td vaccine (2 - Td or Tdap) 01/25/2023   Medicare Annual Wellness Visit  06/03/2023   DEXA scan (bone density measurement)  Completed   Hepatitis C Screening: USPSTF Recommendation to screen - Ages 45-79 yo.  Completed   Zoster (Shingles) Vaccine  Completed   HPV Vaccine  Aged Out    Next appointment: Follow up in one year for your annual wellness visit.   Preventive Care 76 Years and Older, Female Preventive care refers to lifestyle choices and visits with your health care provider that can promote health and wellness. What does preventive care include? A yearly physical exam. This is also called an annual well check. Dental exams once or twice a year. Routine eye exams. Ask your health care provider how often you should have your eyes checked. Personal lifestyle choices, including: Daily care of your teeth and gums. Regular physical activity. Eating a healthy diet. Avoiding tobacco and drug use. Limiting alcohol use. Practicing safe sex. Taking low-dose aspirin every day. Taking vitamin and mineral supplements as recommended by your health care provider. What happens during an annual well check? The services and screenings done by your health care provider during your annual well check will depend on your age, overall health, lifestyle risk factors, and family history of disease. Counseling  Your health care provider may ask you questions about your: Alcohol  use. Tobacco use. Drug use. Emotional well-being. Home and relationship well-being. Sexual activity. Eating habits. History of falls. Memory and ability to understand (cognition). Work and work Astronomer. Reproductive health. Screening  You may have the following tests or measurements: Height, weight, and BMI. Blood pressure. Lipid and cholesterol levels. These may be checked every 5 years, or more frequently if you are over 52 years old. Skin check. Lung cancer screening. You may have this screening every year starting at age 45 if you have a 30-pack-year history of smoking and currently smoke or have quit within the past 15 years. Fecal occult blood test (FOBT) of the stool. You may have this test every year starting at age 58. Flexible sigmoidoscopy or colonoscopy. You may have a sigmoidoscopy every 5 years or a colonoscopy every 10 years starting at age 22. Hepatitis C blood test. Hepatitis B blood test. Sexually transmitted disease (STD) testing. Diabetes screening. This is done by checking your blood sugar (glucose) after you have not eaten for a while (fasting). You may have this done every 1-3 years. Bone density scan. This is done to screen for osteoporosis. You may have this done starting at age 9. Mammogram. This may be done every 1-2 years. Talk to your health care provider about how often you should have regular mammograms. Talk with your health care provider about your test results, treatment options, and if necessary, the need for more tests. Vaccines  Your health care provider may recommend certain vaccines, such as: Influenza vaccine. This is  recommended every year. Tetanus, diphtheria, and acellular pertussis (Tdap, Td) vaccine. You may need a Td booster every 10 years. Zoster vaccine. You may need this after age 49. Pneumococcal 13-valent conjugate (PCV13) vaccine. One dose is recommended after age 53. Pneumococcal polysaccharide (PPSV23) vaccine. One dose is  recommended after age 46. Talk to your health care provider about which screenings and vaccines you need and how often you need them. This information is not intended to replace advice given to you by your health care provider. Make sure you discuss any questions you have with your health care provider. Document Released: 02/27/2015 Document Revised: 10/21/2015 Document Reviewed: 12/02/2014 Elsevier Interactive Patient Education  2017 [place] Prevention in the Home Falls can cause injuries. They can happen to people of all ages. There are many things you can do to make your home safe and to help prevent falls. What can I do on the outside of my home? Regularly fix the edges of walkways and driveways and fix any cracks. Remove anything that might make you trip as you walk through a door, such as a raised step or threshold. Trim any bushes or trees on the path to your home. Use bright outdoor lighting. Clear any walking paths of anything that might make someone trip, such as rocks or tools. Regularly check to see if handrails are loose or broken. Make sure that both sides of any steps have handrails. Any raised decks and porches should have guardrails on the edges. Have any leaves, snow, or ice cleared regularly. Use sand or salt on walking paths during winter. Clean up any spills in your garage right away. This includes oil or grease spills. What can I do in the bathroom? Use night lights. Install grab bars by the toilet and in the tub and shower. Do not use towel bars as grab bars. Use non-skid mats or decals in the tub or shower. If you need to sit down in the shower, use a plastic, non-slip stool. Keep the floor dry. Clean up any water that spills on the floor as soon as it happens. Remove soap buildup in the tub or shower regularly. Attach bath mats securely with double-sided non-slip rug tape. Do not have throw rugs and other things on the floor that can make you  trip. What can I do in the bedroom? Use night lights. Make sure that you have a light by your bed that is easy to reach. Do not use any sheets or blankets that are too big for your bed. They should not hang down onto the floor. Have a firm chair that has side arms. You can use this for support while you get dressed. Do not have throw rugs and other things on the floor that can make you trip. What can I do in the kitchen? Clean up any spills right away. Avoid walking on wet floors. Keep items that you use a lot in easy-to-reach places. If you need to reach something above you, use a strong step stool that has a grab bar. Keep electrical cords out of the way. Do not use floor polish or wax that makes floors slippery. If you must use wax, use non-skid floor wax. Do not have throw rugs and other things on the floor that can make you trip. What can I do with my stairs? Do not leave any items on the stairs. Make sure that there are handrails on both sides of the stairs and use them. Fix handrails that are  broken or loose. Make sure that handrails are as long as the stairways. Check any carpeting to make sure that it is firmly attached to the stairs. Fix any carpet that is loose or worn. Avoid having throw rugs at the top or bottom of the stairs. If you do have throw rugs, attach them to the floor with carpet tape. Make sure that you have a light switch at the top of the stairs and the bottom of the stairs. If you do not have them, ask someone to add them for you. What else can I do to help prevent falls? Wear shoes that: Do not have high heels. Have rubber bottoms. Are comfortable and fit you well. Are closed at the toe. Do not wear sandals. If you use a stepladder: Make sure that it is fully opened. Do not climb a closed stepladder. Make sure that both sides of the stepladder are locked into place. Ask someone to hold it for you, if possible. Clearly mark and make sure that you can  see: Any grab bars or handrails. First and last steps. Where the edge of each step is. Use tools that help you move around (mobility aids) if they are needed. These include: Canes. Walkers. Scooters. Crutches. Turn on the lights when you go into a dark area. Replace any light bulbs as soon as they burn out. Set up your furniture so you have a clear path. Avoid moving your furniture around. If any of your floors are uneven, fix them. If there are any pets around you, be aware of where they are. Review your medicines with your doctor. Some medicines can make you feel dizzy. This can increase your chance of falling. Ask your doctor what other things that you can do to help prevent falls. This information is not intended to replace advice given to you by your health care provider. Make sure you discuss any questions you have with your health care provider. Document Released: 11/27/2008 Document Revised: 07/09/2015 Document Reviewed: 03/07/2014 Elsevier Interactive Patient Education  2017 [employer].

## 2022-06-03 NOTE — Progress Notes (Signed)
Subjective:  Pt completed ADLs, Fall risk, and SDOH during e-check in on 05/27/22.  Answers verified with pt.    Sherry Ross is a 67 y.o. female who presents for Medicare Annual (Subsequent) preventive examination.  I connected with  Sherry Ross on 06/03/22 by a audio enabled telemedicine application and verified that I am speaking with the correct person using two identifiers.  Patient Location: Home  Provider Location: Office/Clinic  I discussed the limitations of evaluation and management by telemedicine. The patient expressed understanding and agreed to proceed.   Review of Systems     Cardiac Risk Factors include: advanced age (>35men, >108 women)     Objective:    There were no vitals filed for this visit. There is no height or weight on file to calculate BMI.     06/03/2022    2:24 PM 02/22/2021    8:34 AM 09/09/2019   10:15 AM  Advanced Directives  Does Patient Have a Medical Advance Directive? Yes Yes Yes  Type of Estate agent of [place];Living will Healthcare Power of [place];Living will   Does patient want to make changes to medical advance directive? No - Patient declined  No - Patient declined  Copy of Healthcare Power of Attorney in Chart? No - copy requested No - copy requested     Current Medications (verified) Outpatient Encounter Medications as of 06/03/2022  Medication Sig   amitriptyline (ELAVIL) 25 MG tablet Take 25 mg by mouth at bedtime.   rosuvastatin (CRESTOR) 10 MG tablet Take 1 tablet (10 mg total) by mouth every other day.   No facility-administered encounter medications on file as of 06/03/2022.    Allergies (verified) Patient has no known allergies.   History: Past Medical History:  Diagnosis Date   Hyperlipidemia    Hypertension    Past Surgical History:  Procedure Laterality Date   ABDOMINAL HYSTERECTOMY  1995   CHOLECYSTECTOMY  1999   [facility]     Family History  Problem Relation Age of Onset    Alcohol abuse Father    Uterine cancer Mother    Breast cancer Mother    Heart disease Brother    Hypertension Brother    Diabetes Brother    Social History   Socioeconomic History   Marital status: Married    Spouse name: Not on file   Number of children: Not on file   Years of education: Not on file   Highest education level: Not on file  Occupational History   Occupation: medical billing    Comment: retired  Tobacco Use   Smoking status: Former    Packs/day: 0.80    Years: 4.00    Additional pack years: 0.00    Total pack years: 3.20    Types: Cigarettes    Quit date: 02/15/1983    Years since quitting: 39.3   Smokeless tobacco: Never  Substance and Sexual Activity   Alcohol use: Yes   Drug use: No   Sexual activity: Not Currently  Other Topics Concern   Not on file  Social History Narrative   Not on file   Social Determinants of Health   Financial Resource Strain: Low Risk  (05/27/2022)   Overall Financial Resource Strain (CARDIA)    Difficulty of Paying Living Expenses: Not hard at all  Food Insecurity: No Food Insecurity (05/27/2022)   Hunger Vital Sign    Worried About Running Out of Food in the Last Year: Never true    Ran Out  of Food in the Last Year: Never true  Transportation Needs: No Transportation Needs (05/27/2022)   PRAPARE - Administrator, Civil Service (Medical): No    Lack of Transportation (Non-Medical): No  Physical Activity: Sufficiently Active (05/27/2022)   Exercise Vital Sign    Days of Exercise per Week: 6 days    Minutes of Exercise per Session: 130 min  Stress: No Stress Concern Present (05/27/2022)   [employer] of Occupational Health - Occupational Stress Questionnaire    Feeling of Stress : Only a little  Social Connections: Unknown (05/27/2022)   Social Connection and Isolation Panel [NHANES]    Frequency of Communication with Friends and Family: Once a week    Frequency of Social Gatherings with Friends and Family:  Twice a week    Attends Religious Services: Not on Marketing executive or Organizations: Yes    Attends Engineer, structural: More than 4 times per year    Marital Status: Married    Tobacco Counseling Counseling given: Not Answered   Clinical Intake:  Pre-visit preparation completed: Yes  Pain : No/denies pain  Nutritional Risks: None Diabetes: No  How often do you need to have someone help you when you read instructions, pamphlets, or other written materials from your doctor or pharmacy?: 1 - Never   Activities of Daily Living    05/27/2022    8:45 AM 04/27/2022    4:05 PM  In your present state of health, do you have any difficulty performing the following activities:  Hearing? 0 0  Vision? 0 0  Difficulty concentrating or making decisions? 1 1  Walking or climbing stairs? 0 0  Dressing or bathing? 0 0  Doing errands, shopping? 0 0  Preparing Food and eating ? N N  Using the Toilet? N N  In the past six months, have you accidently leaked urine? Y Y  Do you have problems with loss of bowel control? Y Y  Managing your Medications? N N  Managing your Finances? N N  Housekeeping or managing your Housekeeping? N N    Patient Care Team: Zola Button, Grayling Congress, DO as PCP - General (Family Medicine) Elmon Else, MD as Consulting Physician (Dermatology)  Indicate any recent Medical Services you may have received from other than [facility] providers in the past year (date may be approximate).     Assessment:   This is a routine wellness examination for Sherry Ross.  Hearing/Vision screen No results found.  Dietary issues and exercise activities discussed: Current Exercise Habits: Home exercise routine, Type of exercise: Other - see comments (pickle ball), Time (Minutes): > 60 (130 min), Frequency (Times/Week): 6, Weekly Exercise (Minutes/Week): 0, Intensity: Moderate, Exercise limited by: None identified   Goals Addressed   None    Depression Screen     06/03/2022    2:27 PM 02/22/2021    8:37 AM 06/20/2017    9:02 AM 01/24/2013    8:06 AM  PHQ 2/9 Scores  PHQ - 2 Score 0 0 0 0    Fall Risk    05/27/2022    8:45 AM 04/27/2022    4:05 PM 02/22/2021    8:35 AM 01/24/2013    8:06 AM  Fall Risk   Falls in the past year? 1 1 0 No  Number falls in past yr: 0 0 0   Injury with Fall? 0 0 0   Risk for fall due to : No Fall Risks  Follow up Falls evaluation completed  Falls prevention discussed     FALL RISK PREVENTION PERTAINING TO THE HOME:  Any stairs in or around the home? No  Home free of loose throw rugs in walkways, pet beds, electrical cords, etc? Yes  Adequate lighting in your home to reduce risk of falls? Yes   ASSISTIVE DEVICES UTILIZED TO PREVENT FALLS:  Life alert? No  Use of a cane, walker or w/c? No  Grab bars in the bathroom? No  Shower chair or bench in shower? Yes  Elevated toilet seat or a handicapped toilet?  Comfort height  TIMED UP AND GO:  Was the test performed?  No, audio visit .    Cognitive Function:        06/03/2022    2:33 PM  6CIT Screen  What Year? 0 points  What month? 0 points  What time? 0 points  Count back from 20 0 points  Months in reverse 0 points  Repeat phrase 2 points  Total Score 2 points    Immunizations Immunization History  Administered Date(s) Administered   Hepatitis A 07/20/2011   Influenza Inj Mdck Quad Pf 11/03/2017   Influenza Split 11/01/2010   Influenza Whole 11/04/2011   Influenza, Quadrivalent, Recombinant, Inj, Pf 11/01/2018   Influenza, Seasonal, Injecte, Preservative Fre 03/01/2015   Influenza,inj,Quad PF,6+ Mos 11/20/2012, 11/15/2013, 12/22/2016   Influenza-Unspecified 10/15/2021   Respiratory Syncytial Virus Vaccine,Recomb Aduvanted(Arexvy) 10/15/2021   Rsv, Mab, Judi Cong, 0.5 Ml, Neonate To 24 Mos(Beyfortus) 10/15/2021   Tdap 01/24/2013   Zoster Recombinat (Shingrix) 10/14/2017, 12/20/2017   Zoster, Live 04/09/2015    TDAP status: Up to  date  Flu Vaccine status: Up to date  Pneumococcal vaccine status: Due, Education has been provided regarding the importance of this vaccine. Advised may receive this vaccine at local pharmacy or Health Dept. Aware to provide a copy of the vaccination record if obtained from local pharmacy or Health Dept. Verbalized acceptance and understanding.  Covid-19 vaccine status: Information provided on how to obtain vaccines.   Qualifies for Shingles Vaccine? Yes   Zostavax completed Yes   Shingrix Completed?: Yes  Screening Tests Health Maintenance  Topic Date Due   COVID-19 Vaccine (1) Never done   Pneumonia Vaccine 68+ Years old (1 of 1 - PCV) Never done   COLONOSCOPY (Pts 45-18yrs Insurance coverage will need to be confirmed)  11/20/2021   Medicare Annual Wellness (AWV)  02/22/2022   MAMMOGRAM  05/21/2022   INFLUENZA VACCINE  09/15/2022   DTaP/Tdap/Td (2 - Td or Tdap) 01/25/2023   DEXA SCAN  Completed   Hepatitis C Screening  Completed   Zoster Vaccines- Shingrix  Completed   HPV VACCINES  Aged Out    Health Maintenance  Health Maintenance Due  Topic Date Due   COVID-19 Vaccine (1) Never done   Pneumonia Vaccine 34+ Years old (1 of 1 - PCV) Never done   COLONOSCOPY (Pts 45-17yrs Insurance coverage will need to be confirmed)  11/20/2021   Medicare Annual Wellness (AWV)  02/22/2022   MAMMOGRAM  05/21/2022    Colorectal cancer screening: Type of screening: Colonoscopy. Completed 11/21/11. Repeat every 10 years  Mammogram status: Completed 06/02/22. Repeat every year [facility]  Bone Density status: Completed 04/08/21. Results reflect: Bone density results: NORMAL. Repeat every 2 years.  Lung Cancer Screening: (Low Dose CT Chest recommended if Age 59-80 years, 30 pack-year currently smoking OR have quit w/in 15years.) does not qualify.    Additional Screening:  Hepatitis C Screening:  does qualify; Completed 04/09/15  Vision Screening: Recommended annual ophthalmology exams for  early detection of glaucoma and other disorders of the eye. Is the patient up to date with their annual eye exam?  Yes  Who is the provider or what is the name of the office in which the patient attends annual eye exams? [facility] If pt is not established with a provider, would they like to be referred to a provider to establish care? No .   Dental Screening: Recommended annual dental exams for proper oral hygiene  Community Resource Referral / Chronic Care Management: CRR required this visit?  No   CCM required this visit?  No      Plan:     I have personally reviewed and noted the following in the patient's chart:   Medical and social history Use of alcohol, tobacco or illicit drugs  Current medications and supplements including opioid prescriptions. Patient is not currently taking opioid prescriptions. Functional ability and status Nutritional status Physical activity Advanced directives List of other physicians Hospitalizations, surgeries, and ER visits in previous 12 months Vitals Screenings to include cognitive, depression, and falls Referrals and appointments  In addition, I have reviewed and discussed with patient certain preventive protocols, quality metrics, and best practice recommendations. A written personalized care plan for preventive services as well as general preventive health recommendations were provided to patient.   Due to this being a telephonic visit, the after visit summary with patients personalized plan was offered to patient via mail or my-chart. Patient would like to access on my-chart.  Donne Anon, [place]   06/03/2022   Nurse Notes: None

## 2022-06-06 DIAGNOSIS — G8918 Other acute postprocedural pain: Secondary | ICD-10-CM | POA: Diagnosis not present

## 2022-06-06 DIAGNOSIS — Y999 Unspecified external cause status: Secondary | ICD-10-CM | POA: Diagnosis not present

## 2022-06-06 DIAGNOSIS — M94262 Chondromalacia, left knee: Secondary | ICD-10-CM | POA: Diagnosis not present

## 2022-06-06 DIAGNOSIS — X58XXXA Exposure to other specified factors, initial encounter: Secondary | ICD-10-CM | POA: Diagnosis not present

## 2022-06-06 DIAGNOSIS — S83242A Other tear of medial meniscus, current injury, left knee, initial encounter: Secondary | ICD-10-CM | POA: Diagnosis not present

## 2022-06-06 DIAGNOSIS — M6752 Plica syndrome, left knee: Secondary | ICD-10-CM | POA: Diagnosis not present

## 2022-06-06 DIAGNOSIS — S83512A Sprain of anterior cruciate ligament of left knee, initial encounter: Secondary | ICD-10-CM | POA: Diagnosis not present

## 2022-06-06 DIAGNOSIS — M1712 Unilateral primary osteoarthritis, left knee: Secondary | ICD-10-CM | POA: Diagnosis not present

## 2022-06-06 DIAGNOSIS — S83272A Complex tear of lateral meniscus, current injury, left knee, initial encounter: Secondary | ICD-10-CM | POA: Diagnosis not present

## 2022-06-07 ENCOUNTER — Encounter: Payer: Self-pay | Admitting: Family Medicine

## 2022-06-07 ENCOUNTER — Encounter: Payer: Medicare HMO | Admitting: Family Medicine

## 2022-06-20 ENCOUNTER — Ambulatory Visit (INDEPENDENT_AMBULATORY_CARE_PROVIDER_SITE_OTHER): Payer: Medicare HMO | Admitting: Family Medicine

## 2022-06-20 ENCOUNTER — Encounter: Payer: Self-pay | Admitting: Family Medicine

## 2022-06-20 VITALS — BP 126/78 | HR 38 | Temp 98.0°F | Resp 18 | Ht 66.0 in | Wt 159.2 lb

## 2022-06-20 DIAGNOSIS — E785 Hyperlipidemia, unspecified: Secondary | ICD-10-CM | POA: Diagnosis not present

## 2022-06-20 DIAGNOSIS — Z0000 Encounter for general adult medical examination without abnormal findings: Secondary | ICD-10-CM | POA: Diagnosis not present

## 2022-06-20 DIAGNOSIS — F5101 Primary insomnia: Secondary | ICD-10-CM | POA: Diagnosis not present

## 2022-06-20 DIAGNOSIS — R001 Bradycardia, unspecified: Secondary | ICD-10-CM

## 2022-06-20 DIAGNOSIS — Z23 Encounter for immunization: Secondary | ICD-10-CM | POA: Diagnosis not present

## 2022-06-20 MED ORDER — ROSUVASTATIN CALCIUM 10 MG PO TABS: 10.0000 mg | ORAL_TABLET | ORAL | 0 refills | Status: DC

## 2022-06-20 MED ORDER — TRAZODONE HCL 50 MG PO TABS: 25.0000 mg | ORAL_TABLET | Freq: Every evening | ORAL | 3 refills | Status: DC | PRN

## 2022-06-20 NOTE — Patient Instructions (Signed)
Preventive Care 65 Years and Older, Female Preventive care refers to lifestyle choices and visits with your health care provider that can promote health and wellness. Preventive care visits are also called wellness exams. What can I expect for my preventive care visit? Counseling Your health care provider may ask you questions about your: Medical history, including: Past medical problems. Family medical history. Pregnancy and menstrual history. History of falls. Current health, including: Memory and ability to understand (cognition). Emotional well-being. Home life and relationship well-being. Sexual activity and sexual health. Lifestyle, including: Alcohol, nicotine or tobacco, and drug use. Access to firearms. Diet, exercise, and sleep habits. Work and work environment. Sunscreen use. Safety issues such as seatbelt and bike helmet use. Physical exam Your health care provider will check your: Height and weight. These may be used to calculate your BMI (body mass index). BMI is a measurement that tells if you are at a healthy weight. Waist circumference. This measures the distance around your waistline. This measurement also tells if you are at a healthy weight and may help predict your risk of certain diseases, such as type 2 diabetes and high blood pressure. Heart rate and blood pressure. Body temperature. Skin for abnormal spots. What immunizations do I need?  Vaccines are usually given at various ages, according to a schedule. Your health care provider will recommend vaccines for you based on your age, medical history, and lifestyle or other factors, such as travel or where you work. What tests do I need? Screening Your health care provider may recommend screening tests for certain conditions. This may include: Lipid and cholesterol levels. Hepatitis C test. Hepatitis B test. HIV (human immunodeficiency virus) test. STI (sexually transmitted infection) testing, if you are at  risk. Lung cancer screening. Colorectal cancer screening. Diabetes screening. This is done by checking your blood sugar (glucose) after you have not eaten for a while (fasting). Mammogram. Talk with your health care provider about how often you should have regular mammograms. BRCA-related cancer screening. This may be done if you have a family history of breast, ovarian, tubal, or peritoneal cancers. Bone density scan. This is done to screen for osteoporosis. Talk with your health care provider about your test results, treatment options, and if necessary, the need for more tests. Follow these instructions at home: Eating and drinking  Eat a diet that includes fresh fruits and vegetables, whole grains, lean protein, and low-fat dairy products. Limit your intake of foods with high amounts of sugar, saturated fats, and salt. Take vitamin and mineral supplements as recommended by your health care provider. Do not drink alcohol if your health care provider tells you not to drink. If you drink alcohol: Limit how much you have to 0-1 drink a day. Know how much alcohol is in your drink. In the [place] one drink equals one 12 oz bottle of beer (355 mL), one 5 oz glass of wine (148 mL), or one 1 oz glass of hard liquor (44 mL). Lifestyle Brush your teeth every morning and night with fluoride toothpaste. Floss one time each day. Exercise for at least 30 minutes 5 or more days each week. Do not use any products that contain nicotine or tobacco. These products include cigarettes, chewing tobacco, and vaping devices, such as e-cigarettes. If you need help quitting, ask your health care provider. Do not use drugs. If you are sexually active, practice safe sex. Use a condom or other form of protection in order to prevent STIs. Take aspirin only as told by   your health care provider. Make sure that you understand how much to take and what form to take. Work with your health care provider to find out whether it  is safe and beneficial for you to take aspirin daily. Ask your health care provider if you need to take a cholesterol-lowering medicine (statin). Find healthy ways to manage stress, such as: Meditation, yoga, or listening to music. Journaling. Talking to a trusted person. Spending time with friends and family. Minimize exposure to UV radiation to reduce your risk of skin cancer. Safety Always wear your seat belt while driving or riding in a vehicle. Do not drive: If you have been drinking alcohol. Do not ride with someone who has been drinking. When you are tired or distracted. While texting. If you have been using any mind-altering substances or drugs. Wear a helmet and other protective equipment during sports activities. If you have firearms in your house, make sure you follow all gun safety procedures. What's next? Visit your health care provider once a year for an annual wellness visit. Ask your health care provider how often you should have your eyes and teeth checked. Stay up to date on all vaccines. This information is not intended to replace advice given to you by your health care provider. Make sure you discuss any questions you have with your health care provider. Document Revised: 07/29/2020 Document Reviewed: 07/29/2020 Elsevier Patient Education  2023 [employer]   

## 2022-06-20 NOTE — Progress Notes (Addendum)
Subjective:   By signing my name below, I, Carlena Bjornstad, attest that this documentation has been prepared under the direction and in the presence of Seabron Spates R, DO. 06/20/2022.   Patient ID: Sherry Ross, female    DOB: 1956/01/26, 67 y.o.   MRN: [id number]  Chief Complaint  Patient presents with   Annual Exam    Pt states fasting     HPI Patient is in today for a comprehensive physical exam.  She continues to follow up routinely with her orthopedist. She states that she is 2 weeks post op from arthroscopy for meniscus and ACL injury exacerbations. She complains of lower extremity aching pains while sitting, relieved with standing.  Since her last physical, she continues to struggle with insomnia. Generally, she is able to fall asleep, but not stay asleep. She has tried many OTC medications. She would like to try [place]. She denies any snoring.  She also follows with her dermatologist given her history of melanoma.  Social history:  No changes to her family medical history.   Colonoscopy:  Per our records, last completed 11/21/2011. She endorses a more recent colonoscopy performed within the the last 5 years, reportedly with finding a polyp. She follows with Dr. Iona Coach. Her regimen includes amitriptyline for IBS. Lately she has more issues with constipation, so she is taking stool softeners.   Dexa:  Last completed 04/08/2021.  Pap Smear:  Last completed 08/11/2010.  Mammogram:  Last completed 06/02/2022.  Immunizations:  We will administer her pneumonia vaccination today. Influenza vaccine last received 10/15/2021.  Tdap received 01/24/2013. Shingrix completed 12/20/2017.  Diet:  She has lost about 25 lbs. She continues to follow an intermittent fasting diet, usually eating from 3-7 PM. She frequently drinks unsweetened tea with lemon. Additionally she has done well with cutting back on carbs. Wt Readings from Last 3 Encounters:  06/20/22 159 lb 3.2 oz (72.2 kg)  06/29/21 167  lb 9.6 oz (76 kg)  02/26/21 169 lb 12.8 oz (77 kg)   Dental/Vision:  She states that she is UTD.  Denies having any fever, new moles, congestion, sinus pain, sore throat, chest pain, palpitations, cough, SOB, wheezing, n/v/d, constipation, blood in stool, dysuria, frequency, hematuria, at this time.  Past Medical History:  Diagnosis Date   Hyperlipidemia    Hypertension     Past Surgical History:  Procedure Laterality Date   ABDOMINAL HYSTERECTOMY  02/14/1993   acl repair Left    [facility]   CHOLECYSTECTOMY  02/14/1997   KNEE CARTILAGE SURGERY Left    [facility]      Family History  Problem Relation Age of Onset   Alcohol abuse Father    Uterine cancer Mother    Breast cancer Mother    Heart disease Brother    Hypertension Brother    Diabetes Brother     Social History   Socioeconomic History   Marital status: Married    Spouse name: Not on file   Number of children: Not on file   Years of education: Not on file   Highest education level: Not on file  Occupational History   Occupation: medical billing    Comment: retired  Tobacco Use   Smoking status: Former    Packs/day: 0.80    Years: 4.00    Additional pack years: 0.00    Total pack years: 3.20    Types: Cigarettes    Quit date: 02/15/1983    Years since quitting: 39.4   Smokeless tobacco:  Never  Substance and Sexual Activity   Alcohol use: Yes   Drug use: No   Sexual activity: Not Currently  Other Topics Concern   Not on file  Social History Narrative   Not on file   Social Determinants of Health   Financial Resource Strain: Low Risk  (05/27/2022)   Overall Financial Resource Strain (CARDIA)    Difficulty of Paying Living Expenses: Not hard at all  Food Insecurity: No Food Insecurity (05/27/2022)   Hunger Vital Sign    Worried About Running Out of Food in the Last Year: Never true    Ran Out of Food in the Last Year: Never true  Transportation Needs: No Transportation Needs (05/27/2022)   PRAPARE -  Administrator, Civil Service (Medical): No    Lack of Transportation (Non-Medical): No  Physical Activity: Sufficiently Active (05/27/2022)   Exercise Vital Sign    Days of Exercise per Week: 6 days    Minutes of Exercise per Session: 130 min  Stress: No Stress Concern Present (05/27/2022)   [employer] of Occupational Health - Occupational Stress Questionnaire    Feeling of Stress : Only a little  Social Connections: Unknown (05/27/2022)   Social Connection and Isolation Panel [NHANES]    Frequency of Communication with Friends and Family: Once a week    Frequency of Social Gatherings with Friends and Family: Twice a week    Attends Religious Services: Not on Marketing executive or Organizations: Yes    Attends Banker Meetings: More than 4 times per year    Marital Status: Married  Catering manager Violence: Not At Risk (06/03/2022)   Humiliation, Afraid, Rape, and Kick questionnaire    Fear of Current or Ex-Partner: No    Emotionally Abused: No    Physically Abused: No    Sexually Abused: No    Outpatient Medications Prior to Visit  Medication Sig Dispense Refill   amitriptyline (ELAVIL) 25 MG tablet Take 25 mg by mouth at bedtime.     rosuvastatin (CRESTOR) 10 MG tablet Take 1 tablet (10 mg total) by mouth every other day. 45 tablet 0   No facility-administered medications prior to visit.    No Known Allergies  Review of Systems  Constitutional:  Negative for fever and malaise/fatigue.  HENT:  Negative for congestion, sinus pain and sore throat.   Eyes:  Negative for blurred vision.  Respiratory:  Negative for cough, shortness of breath and wheezing.   Cardiovascular:  Negative for chest pain, palpitations and leg swelling.  Gastrointestinal:  Negative for abdominal pain, blood in stool, constipation, diarrhea, nausea and vomiting.  Genitourinary:  Negative for dysuria, frequency and hematuria.  Musculoskeletal:  Positive for  myalgias (BLE aching pains). Negative for falls.  Skin:  Negative for rash.       (-) New moles.  Neurological:  Negative for dizziness, loss of consciousness and headaches.  Endo/Heme/Allergies:  Negative for environmental allergies.  Psychiatric/Behavioral:  Negative for depression. The patient has insomnia. The patient is not nervous/anxious.        Objective:    Physical Exam Vitals and nursing note reviewed.  Constitutional:      Appearance: Normal appearance.  HENT:     Head: Normocephalic and atraumatic.     Right Ear: Tympanic membrane, ear canal and external ear normal.     Left Ear: Tympanic membrane, ear canal and external ear normal.     Mouth/Throat:  Mouth: Mucous membranes are moist.     Pharynx: Oropharynx is clear. No oropharyngeal exudate or posterior oropharyngeal erythema.  Eyes:     Extraocular Movements: Extraocular movements intact.     Pupils: Pupils are equal, round, and reactive to light.  Cardiovascular:     Rate and Rhythm: Normal rate and regular rhythm.     Heart sounds: Normal heart sounds. No murmur heard.    No gallop.  Pulmonary:     Effort: Pulmonary effort is normal. No respiratory distress.     Breath sounds: Normal breath sounds. No wheezing or rales.  Abdominal:     General: Bowel sounds are normal. There is no distension.     Palpations: Abdomen is soft.     Tenderness: There is no abdominal tenderness. There is no guarding.  Musculoskeletal:        General: No tenderness. Normal range of motion.  Skin:    General: Skin is warm and dry.  Neurological:     General: No focal deficit present.     Mental Status: She is alert and oriented to person, place, and time.     Gait: Gait normal.  Psychiatric:        Mood and Affect: Mood normal.        Behavior: Behavior normal.     BP 126/78 (BP Location: Left Arm, Patient Position: Sitting, Cuff Size: Normal)   Pulse (!) 38   Temp 98 F (36.7 C) (Oral)   Resp 18   Ht 5\' 6"  (1.676  m)   Wt 159 lb 3.2 oz (72.2 kg)   LMP 02/14/1993   SpO2 98%   BMI 25.70 kg/m  Wt Readings from Last 3 Encounters:  06/20/22 159 lb 3.2 oz (72.2 kg)  06/29/21 167 lb 9.6 oz (76 kg)  02/26/21 169 lb 12.8 oz (77 kg)    Diabetic Foot Exam - Simple   No data filed    Lab Results  Component Value Date   WBC 9.4 06/20/2022   HGB 13.5 06/20/2022   HCT 40.5 06/20/2022   PLT 212.0 06/20/2022   GLUCOSE 79 06/20/2022   CHOL 160 06/20/2022   TRIG 100.0 06/20/2022   HDL 63.60 06/20/2022   LDLDIRECT 142.0 06/20/2017   LDLCALC 77 06/20/2022   ALT 16 06/20/2022   AST 21 06/20/2022   NA 138 06/20/2022   K 4.7 06/20/2022   CL 99 06/20/2022   CREATININE 0.55 06/20/2022   BUN 18 06/20/2022   CO2 26 06/20/2022   TSH 1.53 06/20/2022   HGBA1C 5.9 07/18/2018    Lab Results  Component Value Date   TSH 1.53 06/20/2022   Lab Results  Component Value Date   WBC 9.4 06/20/2022   HGB 13.5 06/20/2022   HCT 40.5 06/20/2022   MCV 92.1 06/20/2022   PLT 212.0 06/20/2022   Lab Results  Component Value Date   NA 138 06/20/2022   K 4.7 06/20/2022   CO2 26 06/20/2022   GLUCOSE 79 06/20/2022   BUN 18 06/20/2022   CREATININE 0.55 06/20/2022   BILITOT 0.8 06/20/2022   ALKPHOS 63 06/20/2022   AST 21 06/20/2022   ALT 16 06/20/2022   PROT 7.2 06/20/2022   ALBUMIN 4.6 06/20/2022   CALCIUM 9.6 06/20/2022   ANIONGAP 13 09/30/2014   GFR 94.57 06/20/2022   Lab Results  Component Value Date   CHOL 160 06/20/2022   Lab Results  Component Value Date   HDL 63.60 06/20/2022   Lab Results  Component Value Date   LDLCALC 77 06/20/2022   Lab Results  Component Value Date   TRIG 100.0 06/20/2022   Lab Results  Component Value Date   CHOLHDL 3 06/20/2022   Lab Results  Component Value Date   HGBA1C 5.9 07/18/2018       Assessment & Plan:   Problem List Items Addressed This Visit       Unprioritized   Primary insomnia   Relevant Medications   traZODone (DESYREL) 50 MG tablet    Preventative health care - Primary    Ghm utd Check labs  See AVS  Health Maintenance  Topic Date Due   COVID-19 Vaccine (1) 07/06/2022 (Originally 02/17/1956)   INFLUENZA VACCINE  09/15/2022   DTaP/Tdap/Td (2 - Td or Tdap) 01/25/2023   MAMMOGRAM  06/02/2023   Medicare Annual Wellness (AWV)  06/03/2023   COLONOSCOPY (Pts 45-54yrs Insurance coverage will need to be confirmed)  08/27/2029   Pneumonia Vaccine 31+ Years old  Completed   DEXA SCAN  Completed   Hepatitis C Screening  Completed   Zoster Vaccines- Shingrix  Completed   HPV VACCINES  Aged Out        RESOLVED: Hyperlipidemia, unspecified   Relevant Medications   rosuvastatin (CRESTOR) 10 MG tablet   Other Relevant Orders   CBC with Differential/Platelet (Completed)   Comprehensive metabolic panel (Completed)   Lipid panel (Completed)   TSH (Completed)   Hyperlipidemia    Encourage heart healthy diet such as MIND or DASH diet, increase exercise, avoid trans fats, simple carbohydrates and processed foods, consider a krill or fish or flaxseed oil cap daily.        Relevant Medications   rosuvastatin (CRESTOR) 10 MG tablet   Other Visit Diagnoses     Need for pneumococcal 20-valent conjugate vaccination       Relevant Orders   Pneumococcal conjugate vaccine 20-valent (Prevnar 20) (Completed)        Meds ordered this encounter  Medications   rosuvastatin (CRESTOR) 10 MG tablet    Sig: Take 1 tablet (10 mg total) by mouth every other day.    Dispense:  45 tablet    Refill:  0   traZODone (DESYREL) 50 MG tablet    Sig: Take 0.5-1 tablets (25-50 mg total) by mouth at bedtime as needed for sleep.    Dispense:  30 tablet    Refill:  3    I, Donato Schultz, DO, personally preformed the services described in this documentation.  All medical record entries made by the scribe were at my direction and in my presence.  I have reviewed the chart and discharge instructions (if applicable) and agree that the record  reflects my personal performance and is accurate and complete. 06/20/2022.  I,Mathew Stumpf,acting as a Neurosurgeon for [employer], DO.,have documented all relevant documentation on the behalf of Donato Schultz, DO,as directed by  Donato Schultz, DO while in the presence of Donato Schultz, DO.   Donato Schultz, DO

## 2022-06-20 NOTE — Assessment & Plan Note (Signed)
Ghm utd Check labs  See AVS  Health Maintenance  Topic Date Due   COVID-19 Vaccine (1) 07/06/2022 (Originally 02/17/1956)   INFLUENZA VACCINE  09/15/2022   DTaP/Tdap/Td (2 - Td or Tdap) 01/25/2023   MAMMOGRAM  06/02/2023   Medicare Annual Wellness (AWV)  06/03/2023   COLONOSCOPY (Pts 45-37yrs Insurance coverage will need to be confirmed)  08/27/2029   Pneumonia Vaccine 84+ Years old  Completed   DEXA SCAN  Completed   Hepatitis C Screening  Completed   Zoster Vaccines- Shingrix  Completed   HPV VACCINES  Aged Out

## 2022-06-20 NOTE — Assessment & Plan Note (Signed)
Encourage heart healthy diet such as MIND or DASH diet, increase exercise, avoid trans fats, simple carbohydrates and processed foods, consider a krill or fish or flaxseed oil cap daily.  °

## 2022-06-21 ENCOUNTER — Encounter: Payer: Self-pay | Admitting: Family Medicine

## 2022-06-21 DIAGNOSIS — M2560 Stiffness of unspecified joint, not elsewhere classified: Secondary | ICD-10-CM | POA: Diagnosis not present

## 2022-06-21 DIAGNOSIS — Z4889 Encounter for other specified surgical aftercare: Secondary | ICD-10-CM | POA: Diagnosis not present

## 2022-06-21 DIAGNOSIS — Z9889 Other specified postprocedural states: Secondary | ICD-10-CM | POA: Diagnosis not present

## 2022-06-21 DIAGNOSIS — Z7409 Other reduced mobility: Secondary | ICD-10-CM | POA: Diagnosis not present

## 2022-06-21 DIAGNOSIS — R269 Unspecified abnormalities of gait and mobility: Secondary | ICD-10-CM | POA: Diagnosis not present

## 2022-06-21 DIAGNOSIS — R531 Weakness: Secondary | ICD-10-CM | POA: Diagnosis not present

## 2022-06-21 DIAGNOSIS — M25562 Pain in left knee: Secondary | ICD-10-CM | POA: Diagnosis not present

## 2022-06-21 LAB — LIPID PANEL
Cholesterol: 160 mg/dL (ref 0–200)
HDL: 63.6 mg/dL (ref >39.00)
LDL Cholesterol: 77 mg/dL (ref 0–99)
NonHDL: 96.66
Total CHOL/HDL Ratio: 3
Triglycerides: 100 mg/dL (ref 0.0–149.0)
VLDL: 20 mg/dL (ref 0.0–40.0)

## 2022-06-21 LAB — COMPREHENSIVE METABOLIC PANEL
ALT: 16 U/L (ref 0–35)
AST: 21 U/L (ref 0–37)
Albumin: 4.6 g/dL (ref 3.5–5.2)
Alkaline Phosphatase: 63 U/L (ref 39–117)
BUN: 18 mg/dL (ref 6–23)
CO2: 26 mEq/L (ref 19–32)
Calcium: 9.6 mg/dL (ref 8.4–10.5)
Chloride: 99 mEq/L (ref 96–112)
Creatinine, Ser: 0.55 mg/dL (ref 0.40–1.20)
GFR: 94.57 mL/min (ref >60.00)
Glucose, Bld: 79 mg/dL (ref 70–99)
Potassium: 4.7 mEq/L (ref 3.5–5.1)
Sodium: 138 mEq/L (ref 135–145)
Total Bilirubin: 0.8 mg/dL (ref 0.2–1.2)
Total Protein: 7.2 g/dL (ref 6.0–8.3)

## 2022-06-21 LAB — CBC WITH DIFFERENTIAL/PLATELET
Basophils Absolute: 0.1 10*3/uL (ref 0.0–0.1)
Basophils Relative: 0.8 % (ref 0.0–3.0)
Eosinophils Absolute: 0.2 10*3/uL (ref 0.0–0.7)
Eosinophils Relative: 1.8 % (ref 0.0–5.0)
HCT: 40.5 % (ref 36.0–46.0)
Hemoglobin: 13.5 g/dL (ref 12.0–15.0)
Lymphocytes Relative: 30.4 % (ref 12.0–46.0)
Lymphs Abs: 2.9 10*3/uL (ref 0.7–4.0)
MCHC: 33.4 g/dL (ref 30.0–36.0)
MCV: 92.1 fl (ref 78.0–100.0)
Monocytes Absolute: 0.7 10*3/uL (ref 0.1–1.0)
Monocytes Relative: 7.5 % (ref 3.0–12.0)
Neutro Abs: 5.6 10*3/uL (ref 1.4–7.7)
Neutrophils Relative %: 59.5 % (ref 43.0–77.0)
Platelets: 212 10*3/uL (ref 150.0–400.0)
RBC: 4.4 Mil/uL (ref 3.87–5.11)
RDW: 13.1 % (ref 11.5–15.5)
WBC: 9.4 10*3/uL (ref 4.0–10.5)

## 2022-06-21 LAB — TSH: TSH: 1.53 u[IU]/mL (ref 0.35–5.50)

## 2022-06-22 ENCOUNTER — Ambulatory Visit: Payer: Medicare HMO | Admitting: Physical Therapy

## 2022-06-24 DIAGNOSIS — Z4889 Encounter for other specified surgical aftercare: Secondary | ICD-10-CM | POA: Diagnosis not present

## 2022-06-24 DIAGNOSIS — M2560 Stiffness of unspecified joint, not elsewhere classified: Secondary | ICD-10-CM | POA: Diagnosis not present

## 2022-06-24 DIAGNOSIS — M25562 Pain in left knee: Secondary | ICD-10-CM | POA: Diagnosis not present

## 2022-06-24 DIAGNOSIS — R269 Unspecified abnormalities of gait and mobility: Secondary | ICD-10-CM | POA: Diagnosis not present

## 2022-06-24 DIAGNOSIS — Z7409 Other reduced mobility: Secondary | ICD-10-CM | POA: Diagnosis not present

## 2022-06-24 DIAGNOSIS — Z9889 Other specified postprocedural states: Secondary | ICD-10-CM | POA: Diagnosis not present

## 2022-06-24 DIAGNOSIS — R531 Weakness: Secondary | ICD-10-CM | POA: Diagnosis not present

## 2022-06-27 DIAGNOSIS — Z4889 Encounter for other specified surgical aftercare: Secondary | ICD-10-CM | POA: Diagnosis not present

## 2022-06-27 DIAGNOSIS — Z9889 Other specified postprocedural states: Secondary | ICD-10-CM | POA: Diagnosis not present

## 2022-06-27 DIAGNOSIS — R531 Weakness: Secondary | ICD-10-CM | POA: Diagnosis not present

## 2022-06-27 DIAGNOSIS — Z7409 Other reduced mobility: Secondary | ICD-10-CM | POA: Diagnosis not present

## 2022-06-27 DIAGNOSIS — M2560 Stiffness of unspecified joint, not elsewhere classified: Secondary | ICD-10-CM | POA: Diagnosis not present

## 2022-06-27 DIAGNOSIS — M25562 Pain in left knee: Secondary | ICD-10-CM | POA: Diagnosis not present

## 2022-06-27 DIAGNOSIS — R269 Unspecified abnormalities of gait and mobility: Secondary | ICD-10-CM | POA: Diagnosis not present

## 2022-06-29 DIAGNOSIS — Z7409 Other reduced mobility: Secondary | ICD-10-CM | POA: Diagnosis not present

## 2022-06-29 DIAGNOSIS — M2560 Stiffness of unspecified joint, not elsewhere classified: Secondary | ICD-10-CM | POA: Diagnosis not present

## 2022-06-29 DIAGNOSIS — Z4889 Encounter for other specified surgical aftercare: Secondary | ICD-10-CM | POA: Diagnosis not present

## 2022-06-29 DIAGNOSIS — M25562 Pain in left knee: Secondary | ICD-10-CM | POA: Diagnosis not present

## 2022-06-29 DIAGNOSIS — R531 Weakness: Secondary | ICD-10-CM | POA: Diagnosis not present

## 2022-06-29 DIAGNOSIS — R269 Unspecified abnormalities of gait and mobility: Secondary | ICD-10-CM | POA: Diagnosis not present

## 2022-06-29 DIAGNOSIS — Z9889 Other specified postprocedural states: Secondary | ICD-10-CM | POA: Diagnosis not present

## 2022-07-03 NOTE — Addendum Note (Signed)
Addended by: Seabron Spates R on: 07/03/2022 01:52 PM   Modules accepted: Level of Service

## 2022-07-08 ENCOUNTER — Encounter: Payer: Self-pay | Admitting: Family Medicine

## 2022-07-08 DIAGNOSIS — Z8249 Family history of ischemic heart disease and other diseases of the circulatory system: Secondary | ICD-10-CM | POA: Diagnosis not present

## 2022-07-08 DIAGNOSIS — R030 Elevated blood-pressure reading, without diagnosis of hypertension: Secondary | ICD-10-CM | POA: Diagnosis not present

## 2022-07-08 DIAGNOSIS — E785 Hyperlipidemia, unspecified: Secondary | ICD-10-CM | POA: Diagnosis not present

## 2022-07-08 DIAGNOSIS — Z85820 Personal history of malignant melanoma of skin: Secondary | ICD-10-CM | POA: Diagnosis not present

## 2022-07-08 DIAGNOSIS — R32 Unspecified urinary incontinence: Secondary | ICD-10-CM | POA: Diagnosis not present

## 2022-07-08 DIAGNOSIS — M1990 Unspecified osteoarthritis, unspecified site: Secondary | ICD-10-CM | POA: Diagnosis not present

## 2022-07-08 DIAGNOSIS — Z833 Family history of diabetes mellitus: Secondary | ICD-10-CM | POA: Diagnosis not present

## 2022-07-08 DIAGNOSIS — K589 Irritable bowel syndrome without diarrhea: Secondary | ICD-10-CM | POA: Diagnosis not present

## 2022-07-08 DIAGNOSIS — Z85828 Personal history of other malignant neoplasm of skin: Secondary | ICD-10-CM | POA: Diagnosis not present

## 2022-07-08 DIAGNOSIS — G4700 Insomnia, unspecified: Secondary | ICD-10-CM | POA: Diagnosis not present

## 2022-07-08 DIAGNOSIS — Z811 Family history of alcohol abuse and dependence: Secondary | ICD-10-CM | POA: Diagnosis not present

## 2022-07-08 DIAGNOSIS — Z008 Encounter for other general examination: Secondary | ICD-10-CM | POA: Diagnosis not present

## 2022-07-08 DIAGNOSIS — Z803 Family history of malignant neoplasm of breast: Secondary | ICD-10-CM | POA: Diagnosis not present

## 2022-07-13 ENCOUNTER — Other Ambulatory Visit: Payer: Self-pay | Admitting: Family Medicine

## 2022-07-13 DIAGNOSIS — R531 Weakness: Secondary | ICD-10-CM | POA: Diagnosis not present

## 2022-07-13 DIAGNOSIS — Z9889 Other specified postprocedural states: Secondary | ICD-10-CM | POA: Diagnosis not present

## 2022-07-13 DIAGNOSIS — F5101 Primary insomnia: Secondary | ICD-10-CM

## 2022-07-13 DIAGNOSIS — M25562 Pain in left knee: Secondary | ICD-10-CM | POA: Diagnosis not present

## 2022-07-13 DIAGNOSIS — R269 Unspecified abnormalities of gait and mobility: Secondary | ICD-10-CM | POA: Diagnosis not present

## 2022-07-13 DIAGNOSIS — Z7409 Other reduced mobility: Secondary | ICD-10-CM | POA: Diagnosis not present

## 2022-07-13 DIAGNOSIS — Z4889 Encounter for other specified surgical aftercare: Secondary | ICD-10-CM | POA: Diagnosis not present

## 2022-07-13 DIAGNOSIS — M2560 Stiffness of unspecified joint, not elsewhere classified: Secondary | ICD-10-CM | POA: Diagnosis not present

## 2022-07-15 ENCOUNTER — Other Ambulatory Visit: Payer: Self-pay | Admitting: Family Medicine

## 2022-07-15 DIAGNOSIS — Z9889 Other specified postprocedural states: Secondary | ICD-10-CM | POA: Diagnosis not present

## 2022-07-15 DIAGNOSIS — Z7409 Other reduced mobility: Secondary | ICD-10-CM | POA: Diagnosis not present

## 2022-07-15 DIAGNOSIS — M25562 Pain in left knee: Secondary | ICD-10-CM | POA: Diagnosis not present

## 2022-07-15 DIAGNOSIS — M2560 Stiffness of unspecified joint, not elsewhere classified: Secondary | ICD-10-CM | POA: Diagnosis not present

## 2022-07-15 DIAGNOSIS — E785 Hyperlipidemia, unspecified: Secondary | ICD-10-CM

## 2022-07-15 DIAGNOSIS — R531 Weakness: Secondary | ICD-10-CM | POA: Diagnosis not present

## 2022-07-15 DIAGNOSIS — R269 Unspecified abnormalities of gait and mobility: Secondary | ICD-10-CM | POA: Diagnosis not present

## 2022-07-15 DIAGNOSIS — Z4889 Encounter for other specified surgical aftercare: Secondary | ICD-10-CM | POA: Diagnosis not present

## 2022-07-22 DIAGNOSIS — M25562 Pain in left knee: Secondary | ICD-10-CM | POA: Diagnosis not present

## 2022-07-22 DIAGNOSIS — S83272D Complex tear of lateral meniscus, current injury, left knee, subsequent encounter: Secondary | ICD-10-CM | POA: Diagnosis not present

## 2022-07-26 DIAGNOSIS — M25462 Effusion, left knee: Secondary | ICD-10-CM | POA: Diagnosis not present

## 2022-07-26 DIAGNOSIS — M25562 Pain in left knee: Secondary | ICD-10-CM | POA: Diagnosis not present

## 2022-08-12 DIAGNOSIS — R300 Dysuria: Secondary | ICD-10-CM | POA: Diagnosis not present

## 2022-08-12 DIAGNOSIS — N390 Urinary tract infection, site not specified: Secondary | ICD-10-CM | POA: Diagnosis not present

## 2022-08-25 DIAGNOSIS — L820 Inflamed seborrheic keratosis: Secondary | ICD-10-CM | POA: Diagnosis not present

## 2022-09-08 DIAGNOSIS — M25562 Pain in left knee: Secondary | ICD-10-CM | POA: Diagnosis not present

## 2022-09-23 DIAGNOSIS — M25562 Pain in left knee: Secondary | ICD-10-CM | POA: Diagnosis not present

## 2022-09-23 DIAGNOSIS — M25662 Stiffness of left knee, not elsewhere classified: Secondary | ICD-10-CM | POA: Diagnosis not present

## 2022-09-29 DIAGNOSIS — M25662 Stiffness of left knee, not elsewhere classified: Secondary | ICD-10-CM | POA: Diagnosis not present

## 2022-09-29 DIAGNOSIS — M25562 Pain in left knee: Secondary | ICD-10-CM | POA: Diagnosis not present

## 2022-10-05 DIAGNOSIS — M25562 Pain in left knee: Secondary | ICD-10-CM | POA: Diagnosis not present

## 2022-10-05 DIAGNOSIS — M25662 Stiffness of left knee, not elsewhere classified: Secondary | ICD-10-CM | POA: Diagnosis not present

## 2022-10-11 DIAGNOSIS — M25562 Pain in left knee: Secondary | ICD-10-CM | POA: Diagnosis not present

## 2022-11-24 DIAGNOSIS — L821 Other seborrheic keratosis: Secondary | ICD-10-CM | POA: Diagnosis not present

## 2022-11-24 DIAGNOSIS — Z85828 Personal history of other malignant neoplasm of skin: Secondary | ICD-10-CM | POA: Diagnosis not present

## 2022-11-24 DIAGNOSIS — Z85820 Personal history of malignant melanoma of skin: Secondary | ICD-10-CM | POA: Diagnosis not present

## 2022-11-24 DIAGNOSIS — L570 Actinic keratosis: Secondary | ICD-10-CM | POA: Diagnosis not present

## 2022-11-24 DIAGNOSIS — L814 Other melanin hyperpigmentation: Secondary | ICD-10-CM | POA: Diagnosis not present

## 2022-11-24 DIAGNOSIS — D1801 Hemangioma of skin and subcutaneous tissue: Secondary | ICD-10-CM | POA: Diagnosis not present

## 2022-12-23 ENCOUNTER — Other Ambulatory Visit: Payer: Self-pay | Admitting: Family Medicine

## 2022-12-23 DIAGNOSIS — E785 Hyperlipidemia, unspecified: Secondary | ICD-10-CM

## 2023-03-18 IMAGING — MR MR HEAD W/O CM
11 series · 48 of 48 positions shown · non-contrast
Comparison: Brain MRI 08/09/2006

CLINICAL DATA: Ringing in the ears starting 5 weeks ago

EXAM:
MRI HEAD WITHOUT CONTRAST
TECHNIQUE: Multiplanar, multiecho pulse sequences of the brain and surrounding
structures were obtained without intravenous contrast.

[Series 2: T1 · sagittal · 5.0mm · 0.45mm/px · 3 of 21 slices shown]
[im 1/21]
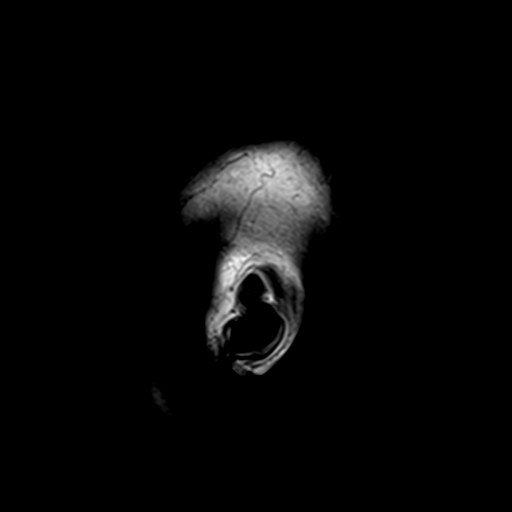
[im 11/21]
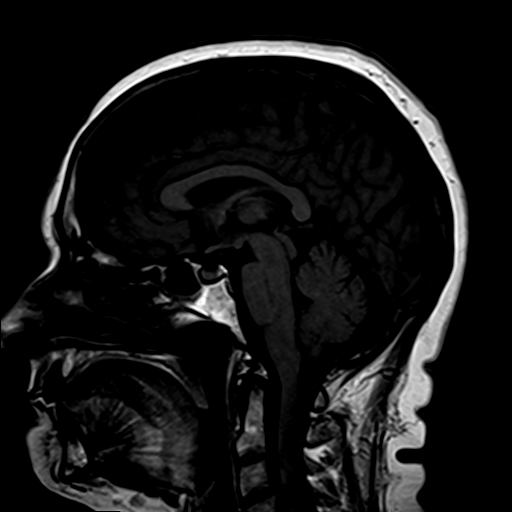
[im 21/21]
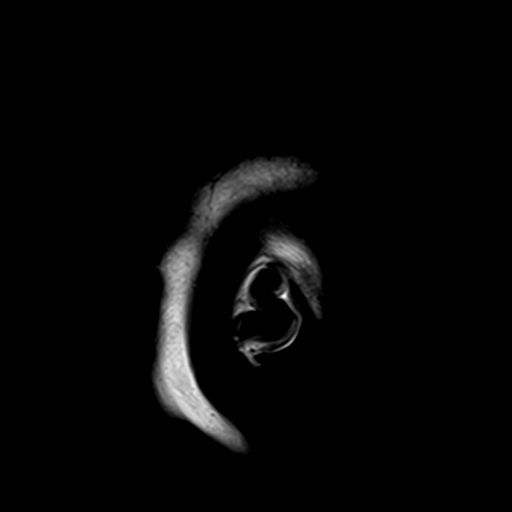

[Series 3: DWI · axial · 3.0mm · 1.80mm/px · z∈[-64,+82]mm · 8 of 100 slices shown (1 of 4)]
[im 1/100]
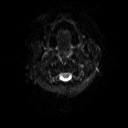
[im 15/100]
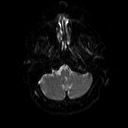
[im 29/100]
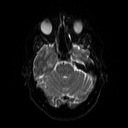
[im 43/100]
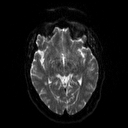
[im 57/100]
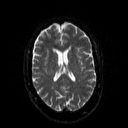
[im 71/100]
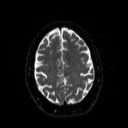
[im 85/100]
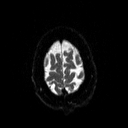
[im 100/100]
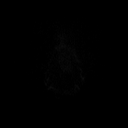

[Series 4: DWI · axial · 3.0mm · 1.80mm/px · z∈[-64,+82]mm · 4 of 49 slices shown (2 of 4)]
[im 1/49]
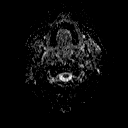
[im 17/49]
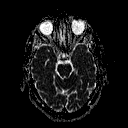
[im 33/49]
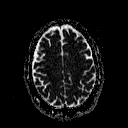
[im 49/49]
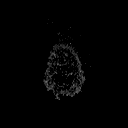

[Series 5: DWI · coronal · 5.0mm · 1.80mm/px · 6 of 72 slices shown (3 of 4)]
[im 1/72]
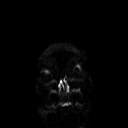
[im 15/72]
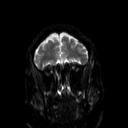
[im 29/72]
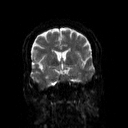
[im 43/72]
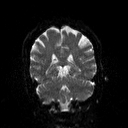
[im 57/72]
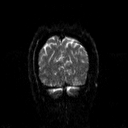
[im 72/72]
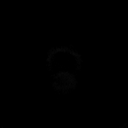

[Series 6: DWI · coronal · 5.0mm · 1.80mm/px · 3 of 36 slices shown (4 of 4)]
[im 1/36]
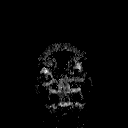
[im 18/36]
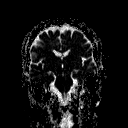
[im 36/36]
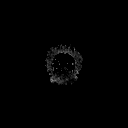

[Series 7: T2 · axial · 5.0mm · 0.60mm/px · z∈[-68,+78]mm · 2 of 22 slices shown (1 of 2)]
[im 1/22]
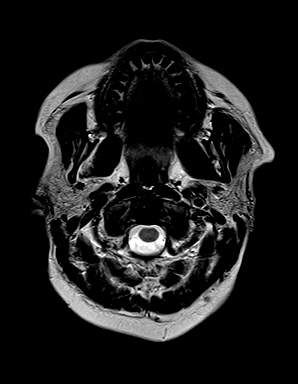
[im 22/22]
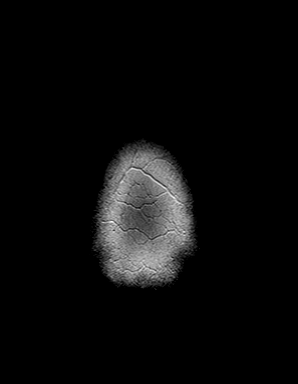

[Series 8: FLAIR · axial · 3.0mm · 0.45mm/px · z∈[-70,+74]mm · 3 of 32 slices shown]
[im 1/32]
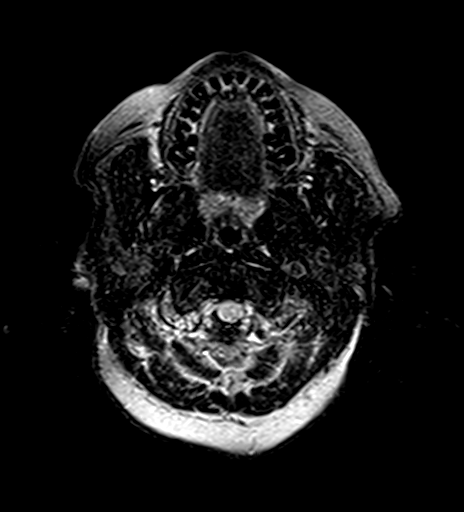
[im 16/32]
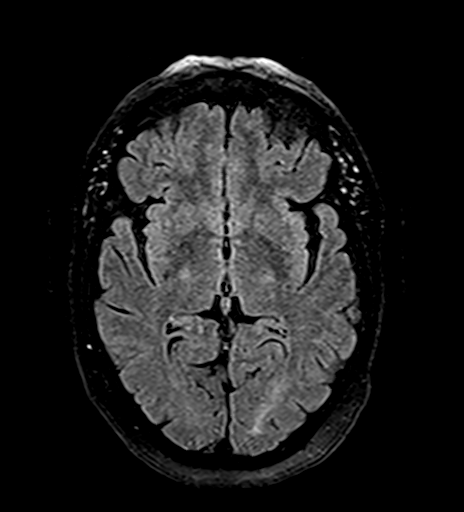
[im 32/32]
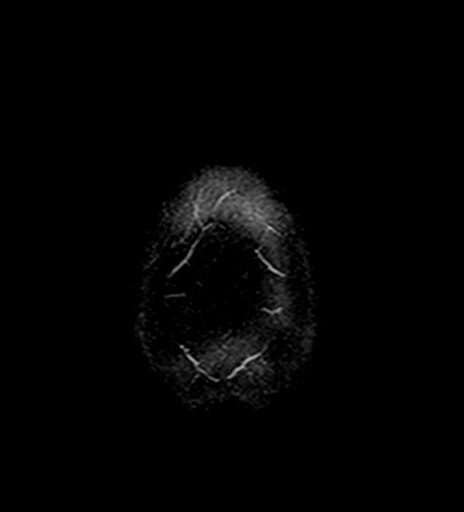

[Series 9: mip_images(sw) · axial · 32.0mm · 0.90mm/px · z∈[-52,+59]mm · 2 of 29 slices shown]
[im 1/29]
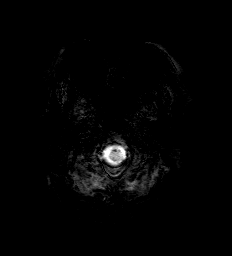
[im 29/29]
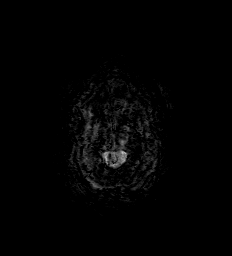

[Series 10: swi_images · axial · 4.0mm · 0.90mm/px · z∈[-66,+73]mm · 3 of 36 slices shown]
[im 1/36]
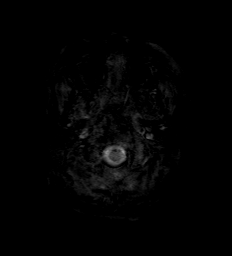
[im 18/36]
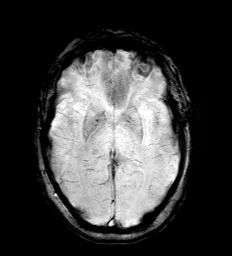
[im 36/36]
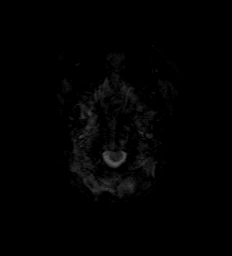

[Series 11: t1_mpr_(person_name) · axial · 1.0mm · 0.71mm/px · z∈[-66,+76]mm · 12 of 144 slices shown]
[im 1/144]
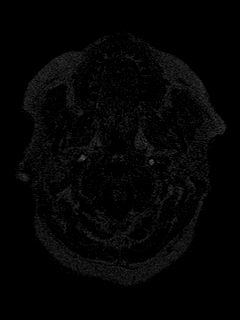
[im 14/144]
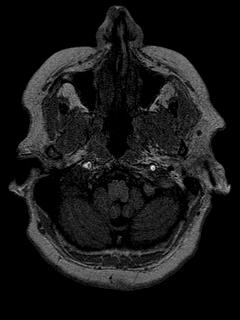
[im 27/144]
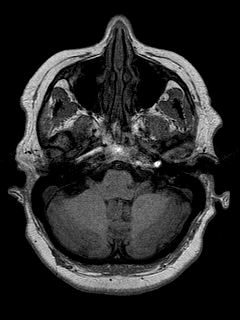
[im 40/144]
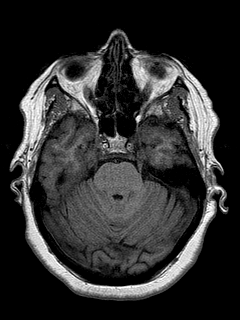
[im 53/144]
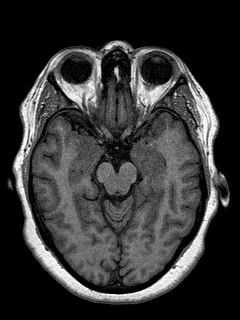
[im 66/144]
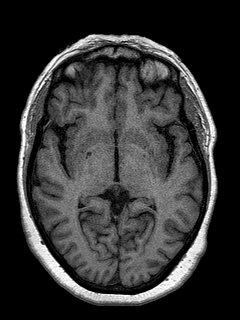
[im 79/144]
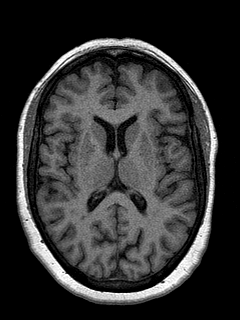
[im 92/144]
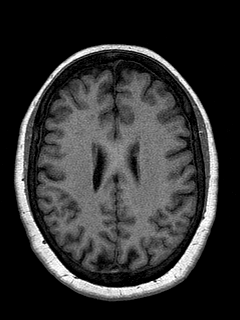
[im 105/144]
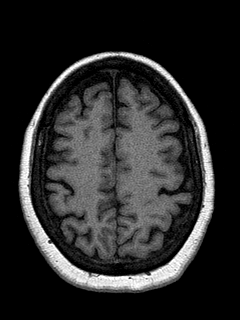
[im 118/144]
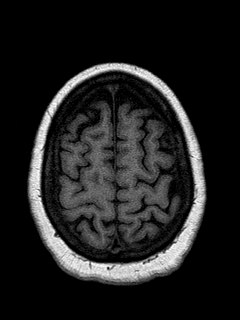
[im 131/144]
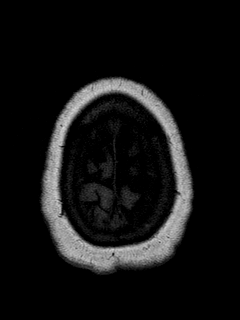
[im 144/144]
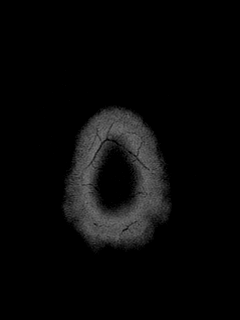

[Series 12: T2 · coronal · 5.0mm · 0.45mm/px · 2 of 28 slices shown (2 of 2)]
[im 1/28]
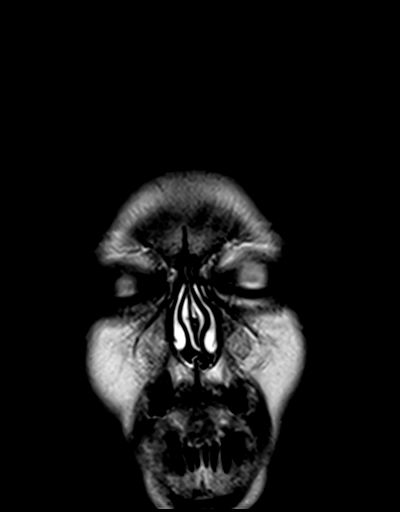
[im 28/28]
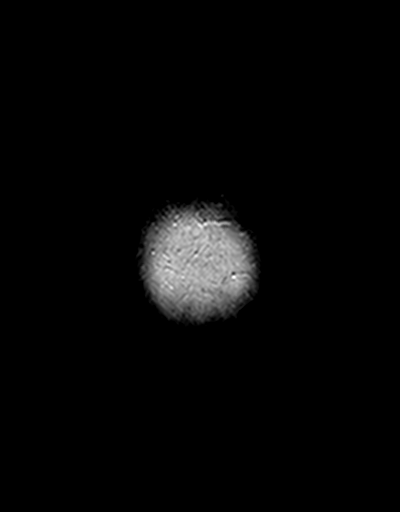

[48 of 48 positions shown; findings below may reference images not displayed]

FINDINGS: Brain: There is no acute intracranial hemorrhage, extra-axial fluid
collection, or acute infarct.

Parenchymal volume is normal. The ventricles are normal in size.
Gray-white differentiation is preserved. Scattered foci of FLAIR
signal abnormality in the subcortical and periventricular white
matter are nonspecific.

There is no solid mass lesion. There is no mass effect or midline
shift.

The noncontrast appearance of the IAC's and cerebellopontine angles
is normal.

Vascular: Normal flow voids.

Skull and upper cervical spine: Normal marrow signal.

Sinuses/Orbits: There is mild mucosal thickening in the ethmoid air
cells. The globes and orbits are unremarkable.

Other: The noncontrast appearance of the inner ear structures is
grossly unremarkable though note that dedicated thin cut sequences
through the IAC's was not performed.
IMPRESSION: 1. Grossly unremarkable noncontrast appearance of the inner ear
structures and IAC's, though note that dedicated thin slice imaging
through the inner ear/IAC's was not performed. Dedicated MRI of the
IAC's may be considered as indicated.
2. Scattered foci of FLAIR signal abnormality in the subcortical and
periventricular white matter are nonspecific but most commonly are
seen in the setting of mild chronic small vessel disease.

## 2023-05-29 ENCOUNTER — Other Ambulatory Visit: Payer: Self-pay | Admitting: Family Medicine

## 2023-05-29 ENCOUNTER — Other Ambulatory Visit (INDEPENDENT_AMBULATORY_CARE_PROVIDER_SITE_OTHER): Payer: Medicare HMO

## 2023-05-29 DIAGNOSIS — E785 Hyperlipidemia, unspecified: Secondary | ICD-10-CM

## 2023-05-29 DIAGNOSIS — Z0000 Encounter for general adult medical examination without abnormal findings: Secondary | ICD-10-CM

## 2023-05-29 LAB — LIPID PANEL
Cholesterol: 154 mg/dL (ref 0–200)
HDL: 66.2 mg/dL (ref >39.00)
LDL Cholesterol: 73 mg/dL (ref 0–99)
NonHDL: 87.65
Total CHOL/HDL Ratio: 2
Triglycerides: 71 mg/dL (ref 0.0–149.0)
VLDL: 14.2 mg/dL (ref 0.0–40.0)

## 2023-05-29 LAB — COMPREHENSIVE METABOLIC PANEL WITH GFR
ALT: 37 U/L — ABNORMAL HIGH (ref 0–35)
AST: 37 U/L (ref 0–37)
Albumin: 4.7 g/dL (ref 3.5–5.2)
Alkaline Phosphatase: 63 U/L (ref 39–117)
BUN: 13 mg/dL (ref 6–23)
CO2: 31 meq/L (ref 19–32)
Calcium: 9.4 mg/dL (ref 8.4–10.5)
Chloride: 104 meq/L (ref 96–112)
Creatinine, Ser: 0.47 mg/dL (ref 0.40–1.20)
GFR: 97.57 mL/min (ref >60.00)
Glucose, Bld: 94 mg/dL (ref 70–99)
Potassium: 4.3 meq/L (ref 3.5–5.1)
Sodium: 142 meq/L (ref 135–145)
Total Bilirubin: 0.6 mg/dL (ref 0.2–1.2)
Total Protein: 6.7 g/dL (ref 6.0–8.3)

## 2023-05-29 LAB — CBC WITH DIFFERENTIAL/PLATELET
Basophils Absolute: 0 10*3/uL (ref 0.0–0.1)
Basophils Relative: 0.7 % (ref 0.0–3.0)
Eosinophils Absolute: 0.3 10*3/uL (ref 0.0–0.7)
Eosinophils Relative: 5.8 % — ABNORMAL HIGH (ref 0.0–5.0)
HCT: 40.8 % (ref 36.0–46.0)
Hemoglobin: 13.6 g/dL (ref 12.0–15.0)
Lymphocytes Relative: 39.6 % (ref 12.0–46.0)
Lymphs Abs: 1.9 10*3/uL (ref 0.7–4.0)
MCHC: 33.4 g/dL (ref 30.0–36.0)
MCV: 94.5 fl (ref 78.0–100.0)
Monocytes Absolute: 0.4 10*3/uL (ref 0.1–1.0)
Monocytes Relative: 8 % (ref 3.0–12.0)
Neutro Abs: 2.2 10*3/uL (ref 1.4–7.7)
Neutrophils Relative %: 45.9 % (ref 43.0–77.0)
Platelets: 197 10*3/uL (ref 150.0–400.0)
RBC: 4.31 Mil/uL (ref 3.87–5.11)
RDW: 14.1 % (ref 11.5–15.5)
WBC: 4.7 10*3/uL (ref 4.0–10.5)

## 2023-05-29 LAB — TSH: TSH: 1.24 u[IU]/mL (ref 0.35–5.50)

## 2023-05-29 NOTE — Addendum Note (Signed)
 Addended by: Susa Engman A on: 05/29/2023 08:33 AM   Modules accepted: Orders

## 2023-06-05 DIAGNOSIS — Z1231 Encounter for screening mammogram for malignant neoplasm of breast: Secondary | ICD-10-CM | POA: Diagnosis not present

## 2023-06-05 LAB — HM MAMMOGRAPHY

## 2023-06-09 ENCOUNTER — Other Ambulatory Visit: Payer: Self-pay | Admitting: Family Medicine

## 2023-06-09 DIAGNOSIS — E785 Hyperlipidemia, unspecified: Secondary | ICD-10-CM

## 2023-06-13 ENCOUNTER — Telehealth: Payer: Self-pay

## 2023-06-13 NOTE — Telephone Encounter (Signed)
 Unsuccessful attempts to reach patient on preferred number listed in notes for scheduled AWV. Left message on voicemail okay to reschedule.

## 2023-06-22 ENCOUNTER — Encounter: Payer: Self-pay | Admitting: Family Medicine

## 2023-06-22 ENCOUNTER — Ambulatory Visit (INDEPENDENT_AMBULATORY_CARE_PROVIDER_SITE_OTHER): Payer: Medicare HMO | Admitting: Family Medicine

## 2023-06-22 VITALS — BP 130/88 | HR 76 | Temp 98.0°F | Resp 18 | Ht 66.0 in | Wt 152.2 lb

## 2023-06-22 DIAGNOSIS — K580 Irritable bowel syndrome with diarrhea: Secondary | ICD-10-CM | POA: Diagnosis not present

## 2023-06-22 DIAGNOSIS — E785 Hyperlipidemia, unspecified: Secondary | ICD-10-CM

## 2023-06-22 DIAGNOSIS — Z0000 Encounter for general adult medical examination without abnormal findings: Secondary | ICD-10-CM

## 2023-06-22 DIAGNOSIS — R748 Abnormal levels of other serum enzymes: Secondary | ICD-10-CM | POA: Diagnosis not present

## 2023-06-22 NOTE — Assessment & Plan Note (Signed)
 Check labs

## 2023-06-22 NOTE — Progress Notes (Signed)
 Established Patient Office Visit  Subjective   Patient ID: Tameesha Zasada, female    DOB: Apr 08, 1955  Age: 68 y.o. MRN: [id number]  Chief Complaint  Patient presents with   Annual Exam    Pt states fasting     HPI Discussed the use of AI scribe software for clinical note transcription with the patient, who gave verbal consent to proceed.  History of Present Illness Esty Biscardi is a 68 year old female who presents with concerns about her liver function tests.  She is concerned about her recent liver function tests, specifically noting a slightly elevated ALT level of 37, compared to the normal range of up to 35. She attributes this elevation to increased alcohol consumption during a recent trip to [place]  from January to mid-April, where she consumed alcohol almost daily. Since returning, she has reduced her alcohol intake and switched to using CBD gummies to help relax instead of drinking wine.  She has a history of IBS managed with Amitiza, which effectively controls her symptoms but causes significant constipation and hemorrhoids. No heartburn issues are reported.  She has a history of using a medication to aid sleep, which caused dizziness and impacted her ability to play pickleball, leading her to discontinue its use. Despite having a torn meniscus, she plays pickleball regularly at the [facility] in [place], using a brace to manage the condition.  She has a history of weight loss attempts, including the use of a gastric balloon in 2015, which was intolerable due to persistent nausea and hair loss, leading to its removal. She maintains her weight through physical activity.  She reports a history of polyps found during colonoscopies, which she underwent every five years, but is currently on a ten-year schedule. She wants more frequent monitoring due to her history.  She mentions experiencing stress related to her marriage, which she believes contributed to her previous alcohol consumption.  She is working on managing this stress and maintaining her relationship.   Patient Active Problem List   Diagnosis Date Noted   Primary insomnia 06/20/2022   Irritable bowel syndrome with diarrhea 01/14/2022   Tinnitus 08/04/2021   Non-recurrent acute serous otitis media of left ear 07/01/2021   Peripheral vascular disease (HCC) 04/25/2018   Dyslipidemia 04/25/2018   Dyspnea on exertion 04/25/2018   Estrogen deficiency 12/21/2017   Diarrhea 12/22/2016   OAB (overactive bladder) 01/24/2013   Obesity (BMI 30-39.9) 01/24/2013   Anxiety and depression 05/04/2012   Atrophic vaginitis 01/03/2012   Hyperlipidemia 08/10/2010   Preventative health care 07/08/2010   Past Medical History:  Diagnosis Date   Hyperlipidemia    Hypertension    Past Surgical History:  Procedure Laterality Date   ABDOMINAL HYSTERECTOMY  02/14/1993   acl repair Left    beane   CHOLECYSTECTOMY  02/14/1997   KNEE CARTILAGE SURGERY Left    Jennye Mola     Social History   Tobacco Use   Smoking status: Former    Current packs/day: 0.00    Average packs/day: 0.8 packs/day for 4.0 years (3.2 ttl pk-yrs)    Types: Cigarettes    Start date: 02/15/1979    Quit date: 02/15/1983    Years since quitting: 40.3   Smokeless tobacco: Never  Substance Use Topics   Alcohol use: Yes   Drug use: No   Social History   Socioeconomic History   Marital status: Married    Spouse name: Not on file   Number of children: Not on file   Years  of education: Not on file   Highest education level: 12th grade  Occupational History   Occupation: medical billing    Comment: retired  Tobacco Use   Smoking status: Former    Current packs/day: 0.00    Average packs/day: 0.8 packs/day for 4.0 years (3.2 ttl pk-yrs)    Types: Cigarettes    Start date: 02/15/1979    Quit date: 02/15/1983    Years since quitting: 40.3   Smokeless tobacco: Never  Substance and Sexual Activity   Alcohol use: Yes   Drug use: No   Sexual activity: Not  Currently  Other Topics Concern   Not on file  Social History Narrative   PICKLE BALL   Social Drivers of Health   Financial Resource Strain: Low Risk  (06/21/2023)   Overall Financial Resource Strain (CARDIA)    Difficulty of Paying Living Expenses: Not hard at all  Food Insecurity: No Food Insecurity (06/21/2023)   Hunger Vital Sign    Worried About Running Out of Food in the Last Year: Never true    Ran Out of Food in the Last Year: Never true  Transportation Needs: No Transportation Needs (06/21/2023)   PRAPARE - Administrator, Civil Service (Medical): No    Lack of Transportation (Non-Medical): No  Physical Activity: Sufficiently Active (06/21/2023)   Exercise Vital Sign    Days of Exercise per Week: 4 days    Minutes of Exercise per Session: 120 min  Stress: Stress Concern Present (06/21/2023)   [employer] of Occupational Health - Occupational Stress Questionnaire    Feeling of Stress : Very much  Social Connections: Socially Integrated (06/21/2023)   Social Connection and Isolation Panel [NHANES]    Frequency of Communication with Friends and Family: Once a week    Frequency of Social Gatherings with Friends and Family: Twice a week    Attends Religious Services: More than 4 times per year    Active Member of [employer] or Organizations: Yes    Attends Banker Meetings: 1 to 4 times per year    Marital Status: Married  Catering manager Violence: Not At Risk (06/03/2022)   Humiliation, Afraid, Rape, and Kick questionnaire    Fear of Current or Ex-Partner: No    Emotionally Abused: No    Physically Abused: No    Sexually Abused: No   Family Status  Relation Name Status   Mother  Deceased   Father  Deceased   Brother  (Not Specified)   Brother  (Not Specified)   Brother  (Not Specified)  No partnership data on file   Family History  Problem Relation Age of Onset   Uterine cancer Mother    Breast cancer Mother    Alcohol abuse Father    Heart  disease Brother    Hypertension Brother    Diabetes Brother    No Known Allergies    Review of Systems  Constitutional:  Negative for chills, fever and malaise/fatigue.  HENT:  Negative for congestion and hearing loss.   Eyes:  Negative for blurred vision and discharge.  Respiratory:  Negative for cough, sputum production and shortness of breath.   Cardiovascular:  Negative for chest pain, palpitations and leg swelling.  Gastrointestinal:  Negative for abdominal pain, blood in stool, constipation, diarrhea, heartburn, nausea and vomiting.  Genitourinary:  Negative for dysuria, frequency, hematuria and urgency.  Musculoskeletal:  Negative for back pain, falls and myalgias.  Skin:  Negative for rash.  Neurological:  Negative for dizziness, sensory change, loss of consciousness, weakness and headaches.  Endo/Heme/Allergies:  Negative for environmental allergies. Does not bruise/bleed easily.  Psychiatric/Behavioral:  Negative for depression and suicidal ideas. The patient is not nervous/anxious and does not have insomnia.       Objective:     BP 130/88 (BP Location: Right Arm, Patient Position: Sitting, Cuff Size: Normal)   Pulse 76   Temp 98 F (36.7 C) (Oral)   Resp 18   Ht 5\' 6"  (1.676 m)   Wt 152 lb 3.2 oz (69 kg)   LMP 02/14/1993   SpO2 100%   BMI 24.57 kg/m  BP Readings from Last 3 Encounters:  06/22/23 130/88  06/20/22 126/78  06/29/21 132/90   Wt Readings from Last 3 Encounters:  06/22/23 152 lb 3.2 oz (69 kg)  06/20/22 159 lb 3.2 oz (72.2 kg)  06/29/21 167 lb 9.6 oz (76 kg)   SpO2 Readings from Last 3 Encounters:  06/22/23 100%  06/20/22 98%  06/29/21 97%      Physical Exam Vitals and nursing note reviewed.  Constitutional:      General: She is not in acute distress.    Appearance: Normal appearance. She is well-developed.  HENT:     Head: Normocephalic and atraumatic.     Right Ear: Tympanic membrane, ear canal and external ear normal. There is no  impacted cerumen.     Left Ear: Tympanic membrane, ear canal and external ear normal. There is no impacted cerumen.     Nose: Nose normal.     Mouth/Throat:     Mouth: Mucous membranes are moist.     Pharynx: Oropharynx is clear. No oropharyngeal exudate or posterior oropharyngeal erythema.  Eyes:     General: No scleral icterus.       Right eye: No discharge.        Left eye: No discharge.     Conjunctiva/sclera: Conjunctivae normal.     Pupils: Pupils are equal, round, and reactive to light.  Neck:     Thyroid : No thyromegaly or thyroid  tenderness.     Vascular: No JVD.  Cardiovascular:     Rate and Rhythm: Normal rate and regular rhythm.     Heart sounds: Normal heart sounds. No murmur heard. Pulmonary:     Effort: Pulmonary effort is normal. No respiratory distress.     Breath sounds: Normal breath sounds.  Abdominal:     General: Bowel sounds are normal. There is no distension.     Palpations: Abdomen is soft. There is no mass.     Tenderness: There is no abdominal tenderness. There is no guarding or rebound.  Genitourinary:    Vagina: Normal.  Musculoskeletal:        General: Normal range of motion.     Cervical back: Normal range of motion and neck supple.     Right lower leg: No edema.     Left lower leg: No edema.  Lymphadenopathy:     Cervical: No cervical adenopathy.  Skin:    General: Skin is warm and dry.     Findings: No erythema or rash.  Neurological:     Mental Status: She is alert and oriented to person, place, and time.     Cranial Nerves: No cranial nerve deficit.     Deep Tendon Reflexes: Reflexes are normal and symmetric.  Psychiatric:        Mood and Affect: Mood normal.        Behavior: Behavior  normal.        Thought Content: Thought content normal.        Judgment: Judgment normal.      No results found for any visits on 06/22/23.  Last CBC Lab Results  Component Value Date   WBC 4.7 05/29/2023   HGB 13.6 05/29/2023   HCT 40.8  05/29/2023   MCV 94.5 05/29/2023   MCH 30.0 09/30/2014   RDW 14.1 05/29/2023   PLT 197.0 05/29/2023   Last metabolic panel Lab Results  Component Value Date   GLUCOSE 94 05/29/2023   NA 142 05/29/2023   K 4.3 05/29/2023   CL 104 05/29/2023   CO2 31 05/29/2023   BUN 13 05/29/2023   CREATININE 0.47 05/29/2023   GFR 97.57 05/29/2023   CALCIUM  9.4 05/29/2023   PROT 6.7 05/29/2023   ALBUMIN 4.7 05/29/2023   BILITOT 0.6 05/29/2023   ALKPHOS 63 05/29/2023   AST 37 05/29/2023   ALT 37 (H) 05/29/2023   ANIONGAP 13 09/30/2014   Last lipids Lab Results  Component Value Date   CHOL 154 05/29/2023   HDL 66.20 05/29/2023   LDLCALC 73 05/29/2023   LDLDIRECT 142.0 06/20/2017   TRIG 71.0 05/29/2023   CHOLHDL 2 05/29/2023   Last hemoglobin A1c Lab Results  Component Value Date   HGBA1C 5.9 07/18/2018   Last thyroid  functions Lab Results  Component Value Date   TSH 1.24 05/29/2023   Last vitamin D  No results found for: "25OHVITD2", "25OHVITD3", "VD25OH" Last vitamin B12 and Folate No results found for: "VITAMINB12", "FOLATE"    The 10-year ASCVD risk score (Arnett DK, et al., 2019) is: 6.8%    Assessment & Plan:   Problem List Items Addressed This Visit       Unprioritized   Hyperlipidemia - Primary   Preventative health care   Ghm utd Check labs  See AVS  Health Maintenance  Topic Date Due   COVID-19 Vaccine (1 - 2024-25 season) Never done   DTaP/Tdap/Td (2 - Td or Tdap) 01/25/2023   Medicare Annual Wellness (AWV)  06/03/2023   INFLUENZA VACCINE  09/15/2023   MAMMOGRAM  06/04/2024   Colonoscopy  08/28/2029   Pneumonia Vaccine 39+ Years old  Completed   DEXA SCAN  Completed   Hepatitis C Screening  Completed   Zoster Vaccines- Shingrix  Completed   HPV VACCINES  Aged Out   Meningococcal B Vaccine  Aged Out         Irritable bowel syndrome with diarrhea   Per GI      Dyslipidemia   Check labs       Other Visit Diagnoses       Elevated liver  enzymes       Relevant Orders   Comprehensive metabolic panel with GFR   Gamma GT   CBC with Differential/Platelet   H. pylori breath test       No follow-ups on file.    Rolande Moe R Lowne Chase, DO

## 2023-06-22 NOTE — Assessment & Plan Note (Signed)
 Per GI

## 2023-06-22 NOTE — Assessment & Plan Note (Signed)
 Ghm utd Check labs  See AVS  Health Maintenance  Topic Date Due   COVID-19 Vaccine (1 - 2024-25 season) Never done   DTaP/Tdap/Td (2 - Td or Tdap) 01/25/2023   Medicare Annual Wellness (AWV)  06/03/2023   INFLUENZA VACCINE  09/15/2023   MAMMOGRAM  06/04/2024   Colonoscopy  08/28/2029   Pneumonia Vaccine 68+ Years old  Completed   DEXA SCAN  Completed   Hepatitis C Screening  Completed   Zoster Vaccines- Shingrix  Completed   HPV VACCINES  Aged Out   Meningococcal B Vaccine  Aged Out

## 2023-06-23 ENCOUNTER — Encounter: Payer: Self-pay | Admitting: Family Medicine

## 2023-06-23 DIAGNOSIS — R7401 Elevation of levels of liver transaminase levels: Secondary | ICD-10-CM | POA: Diagnosis not present

## 2023-06-23 DIAGNOSIS — K581 Irritable bowel syndrome with constipation: Secondary | ICD-10-CM | POA: Diagnosis not present

## 2023-06-23 DIAGNOSIS — R1013 Epigastric pain: Secondary | ICD-10-CM | POA: Diagnosis not present

## 2023-06-23 DIAGNOSIS — Z860100 Personal history of colon polyps, unspecified: Secondary | ICD-10-CM | POA: Diagnosis not present

## 2023-06-23 LAB — CBC WITH DIFFERENTIAL/PLATELET
Basophils Absolute: 0.1 10*3/uL (ref 0.0–0.1)
Basophils Relative: 0.8 % (ref 0.0–3.0)
Eosinophils Absolute: 0.2 10*3/uL (ref 0.0–0.7)
Eosinophils Relative: 2.5 % (ref 0.0–5.0)
HCT: 38.9 % (ref 36.0–46.0)
Hemoglobin: 12.9 g/dL (ref 12.0–15.0)
Lymphocytes Relative: 34.5 % (ref 12.0–46.0)
Lymphs Abs: 2.3 10*3/uL (ref 0.7–4.0)
MCHC: 33.2 g/dL (ref 30.0–36.0)
MCV: 94 fl (ref 78.0–100.0)
Monocytes Absolute: 0.5 10*3/uL (ref 0.1–1.0)
Monocytes Relative: 7.9 % (ref 3.0–12.0)
Neutro Abs: 3.6 10*3/uL (ref 1.4–7.7)
Neutrophils Relative %: 54.3 % (ref 43.0–77.0)
Platelets: 216 10*3/uL (ref 150.0–400.0)
RBC: 4.14 Mil/uL (ref 3.87–5.11)
RDW: 13.4 % (ref 11.5–15.5)
WBC: 6.6 10*3/uL (ref 4.0–10.5)

## 2023-06-23 LAB — COMPREHENSIVE METABOLIC PANEL WITH GFR
ALT: 49 U/L — ABNORMAL HIGH (ref 0–35)
AST: 35 U/L (ref 0–37)
Albumin: 4.4 g/dL (ref 3.5–5.2)
Alkaline Phosphatase: 49 U/L (ref 39–117)
BUN: 12 mg/dL (ref 6–23)
CO2: 27 meq/L (ref 19–32)
Calcium: 9.2 mg/dL (ref 8.4–10.5)
Chloride: 105 meq/L (ref 96–112)
Creatinine, Ser: 0.62 mg/dL (ref 0.40–1.20)
GFR: 91.23 mL/min (ref >60.00)
Glucose, Bld: 94 mg/dL (ref 70–99)
Potassium: 4.5 meq/L (ref 3.5–5.1)
Sodium: 141 meq/L (ref 135–145)
Total Bilirubin: 0.9 mg/dL (ref 0.2–1.2)
Total Protein: 6.6 g/dL (ref 6.0–8.3)

## 2023-06-23 LAB — GAMMA GT: GGT: 21 U/L (ref 7–51)

## 2023-06-23 LAB — H. PYLORI BREATH TEST: H. pylori Breath Test: NOT DETECTED

## 2023-06-26 ENCOUNTER — Other Ambulatory Visit: Payer: Self-pay | Admitting: Family Medicine

## 2023-06-26 DIAGNOSIS — R1013 Epigastric pain: Secondary | ICD-10-CM | POA: Diagnosis not present

## 2023-06-26 DIAGNOSIS — K2950 Unspecified chronic gastritis without bleeding: Secondary | ICD-10-CM | POA: Diagnosis not present

## 2023-06-26 DIAGNOSIS — K2100 Gastro-esophageal reflux disease with esophagitis, without bleeding: Secondary | ICD-10-CM | POA: Diagnosis not present

## 2023-06-26 DIAGNOSIS — R748 Abnormal levels of other serum enzymes: Secondary | ICD-10-CM

## 2023-06-26 DIAGNOSIS — K2289 Other specified disease of esophagus: Secondary | ICD-10-CM | POA: Diagnosis not present

## 2023-06-27 ENCOUNTER — Encounter: Payer: Self-pay | Admitting: Family Medicine

## 2023-06-27 ENCOUNTER — Telehealth (HOSPITAL_BASED_OUTPATIENT_CLINIC_OR_DEPARTMENT_OTHER): Payer: Self-pay

## 2023-06-28 ENCOUNTER — Ambulatory Visit: Payer: Self-pay

## 2023-06-30 ENCOUNTER — Encounter: Payer: Self-pay | Admitting: Family Medicine

## 2023-06-30 ENCOUNTER — Ambulatory Visit (HOSPITAL_BASED_OUTPATIENT_CLINIC_OR_DEPARTMENT_OTHER)
Admission: RE | Admit: 2023-06-30 | Discharge: 2023-06-30 | Disposition: A | Discharge status: Home / Self Care | Source: Ambulatory Visit | Attending: Family Medicine | Admitting: Family Medicine

## 2023-06-30 ENCOUNTER — Ambulatory Visit: Payer: Self-pay | Admitting: Family Medicine

## 2023-06-30 DIAGNOSIS — R748 Abnormal levels of other serum enzymes: Secondary | ICD-10-CM | POA: Diagnosis not present

## 2023-07-19 ENCOUNTER — Ambulatory Visit (INDEPENDENT_AMBULATORY_CARE_PROVIDER_SITE_OTHER): Admitting: Physician Assistant

## 2023-07-19 ENCOUNTER — Encounter: Payer: Self-pay | Admitting: Family Medicine

## 2023-07-19 ENCOUNTER — Encounter: Payer: Self-pay | Admitting: Physician Assistant

## 2023-07-19 VITALS — BP 112/74 | HR 67 | Ht 66.0 in | Wt 148.2 lb

## 2023-07-19 DIAGNOSIS — N3941 Urge incontinence: Secondary | ICD-10-CM | POA: Diagnosis not present

## 2023-07-19 DIAGNOSIS — N3000 Acute cystitis without hematuria: Secondary | ICD-10-CM | POA: Diagnosis not present

## 2023-07-19 DIAGNOSIS — R300 Dysuria: Secondary | ICD-10-CM | POA: Diagnosis not present

## 2023-07-19 LAB — POCT URINALYSIS DIP (MANUAL ENTRY)
Bilirubin, UA: NEGATIVE
Blood, UA: NEGATIVE
Glucose, UA: NEGATIVE mg/dL
Ketones, POC UA: NEGATIVE mg/dL
Nitrite, UA: NEGATIVE
Protein Ur, POC: NEGATIVE mg/dL
Spec Grav, UA: <=1.005 — AB (ref 1.010–1.025)
Urobilinogen, UA: 0.2 U/dL
pH, UA: 6 (ref 5.0–8.0)

## 2023-07-19 MED ORDER — CEPHALEXIN 500 MG PO CAPS: 500.0000 mg | ORAL_CAPSULE | Freq: Two times a day (BID) | ORAL | 0 refills | Status: AC

## 2023-07-19 NOTE — Telephone Encounter (Signed)
 Pt has appt with Loris Ros already at 10am

## 2023-07-19 NOTE — Progress Notes (Signed)
 Established patient visit   Patient: Sherry Ross   DOB: 1955-04-14   68 y.o. Female  MRN: [id number] Visit Date: 07/19/2023  Today's healthcare provider: Trenton Frock, PA-C   Cc. Uti sxs  Subjective     Pt reports dysuria, urinary frequency, urgency, difficult to hold urine x 2 days.  She also reports in general, it is getting harder to hold her urine when she is full. Has to wear liners and even these are not enough lately. H/o hysterectomy  Medications: Outpatient Medications Prior to Visit  Medication Sig   amitriptyline (ELAVIL) 25 MG tablet Take 25 mg by mouth at bedtime.   rosuvastatin  (CRESTOR ) 10 MG tablet TAKE 1 TABLET BY MOUTH EVERY OTHER DAY   No facility-administered medications prior to visit.    Review of Systems  Constitutional:  Negative for fatigue and fever.  Respiratory:  Negative for cough and shortness of breath.   Cardiovascular:  Negative for chest pain and leg swelling.  Gastrointestinal:  Negative for abdominal pain.  Genitourinary:  Positive for dysuria, genital sores and urgency.  Neurological:  Negative for dizziness and headaches.       Objective    BP 112/74   Pulse 67   Ht 5\' 6"  (1.676 m)   Wt 148 lb 3.2 oz (67.2 kg)   LMP 02/14/1993   BMI 23.92 kg/m    Physical Exam Constitutional:      General: She is awake.     Appearance: She is well-developed.  HENT:     Head: Normocephalic.  Eyes:     Conjunctiva/sclera: Conjunctivae normal.  Cardiovascular:     Rate and Rhythm: Normal rate and regular rhythm.     Heart sounds: Normal heart sounds.  Pulmonary:     Effort: Pulmonary effort is normal.     Breath sounds: Normal breath sounds.  Abdominal:     Tenderness: There is no right CVA tenderness or left CVA tenderness.  Skin:    General: Skin is warm.  Neurological:     Mental Status: She is alert and oriented to person, place, and time.  Psychiatric:        Attention and Perception: Attention normal.        Mood  and Affect: Mood normal.        Speech: Speech normal.        Behavior: Behavior is cooperative.     Results for orders placed or performed in visit on 07/19/23  POCT urinalysis dipstick  Result Value Ref Range   Color, UA yellow yellow   Clarity, UA hazy (A) clear   Glucose, UA negative negative mg/dL   Bilirubin, UA negative negative   Ketones, POC UA negative negative mg/dL   Spec Grav, UA <=7.846 (A) 1.010 - 1.025   Blood, UA negative negative   pH, UA 6.0 5.0 - 8.0   Protein Ur, POC negative negative mg/dL   Urobilinogen, UA 0.2 0.2 or 1.0 E.U./dL   Nitrite, UA Negative Negative   Leukocytes, UA Large (3+) (A) Negative    Assessment & Plan    Acute cystitis without hematuria -     Urine Culture -     Cephalexin ; Take 1 capsule (500 mg total) by mouth 2 (two) times daily for 5 days.  Dispense: 10 capsule; Refill: 0  Dysuria -     POCT urinalysis dipstick  Urinary incontinence, urge -     Ambulatory referral to Physical Therapy   UA + leuks,  rx keflexb bid x 5 days, ordering culture.  Given progressive urge incontinence, referring to pelvic PT  Return if symptoms worsen or fail to improve.       Trenton Frock, PA-C  [facility] [phone] (phone) [phone] (fax)  [facility]

## 2023-07-20 LAB — URINE CULTURE
MICRO NUMBER:: 16537939
Result:: NO GROWTH
SPECIMEN QUALITY:: ADEQUATE

## 2023-07-21 ENCOUNTER — Ambulatory Visit: Payer: Self-pay | Admitting: Physician Assistant

## 2023-07-26 DIAGNOSIS — F439 Reaction to severe stress, unspecified: Secondary | ICD-10-CM | POA: Diagnosis not present

## 2023-07-31 ENCOUNTER — Encounter: Payer: Self-pay | Admitting: Family Medicine

## 2023-07-31 ENCOUNTER — Other Ambulatory Visit: Payer: Self-pay | Admitting: Family Medicine

## 2023-07-31 DIAGNOSIS — N898 Other specified noninflammatory disorders of vagina: Secondary | ICD-10-CM

## 2023-07-31 DIAGNOSIS — N941 Unspecified dyspareunia: Secondary | ICD-10-CM | POA: Diagnosis not present

## 2023-07-31 DIAGNOSIS — N3941 Urge incontinence: Secondary | ICD-10-CM | POA: Diagnosis not present

## 2023-07-31 DIAGNOSIS — R102 Pelvic and perineal pain: Secondary | ICD-10-CM | POA: Diagnosis not present

## 2023-07-31 DIAGNOSIS — R3916 Straining to void: Secondary | ICD-10-CM | POA: Diagnosis not present

## 2023-07-31 DIAGNOSIS — R3915 Urgency of urination: Secondary | ICD-10-CM | POA: Diagnosis not present

## 2023-07-31 MED ORDER — ESTROGENS CONJUGATED 0.625 MG/GM VA CREA: 1.0000 | TOPICAL_CREAM | Freq: Every day | VAGINAL | 1 refills | Status: DC

## 2023-08-08 DIAGNOSIS — R3915 Urgency of urination: Secondary | ICD-10-CM | POA: Diagnosis not present

## 2023-08-08 DIAGNOSIS — F439 Reaction to severe stress, unspecified: Secondary | ICD-10-CM | POA: Diagnosis not present

## 2023-08-08 DIAGNOSIS — R3916 Straining to void: Secondary | ICD-10-CM | POA: Diagnosis not present

## 2023-08-08 DIAGNOSIS — N941 Unspecified dyspareunia: Secondary | ICD-10-CM | POA: Diagnosis not present

## 2023-08-08 DIAGNOSIS — N3941 Urge incontinence: Secondary | ICD-10-CM | POA: Diagnosis not present

## 2023-08-08 DIAGNOSIS — R102 Pelvic and perineal pain: Secondary | ICD-10-CM | POA: Diagnosis not present

## 2023-08-11 DIAGNOSIS — G8929 Other chronic pain: Secondary | ICD-10-CM | POA: Diagnosis not present

## 2023-08-11 DIAGNOSIS — M25561 Pain in right knee: Secondary | ICD-10-CM | POA: Diagnosis not present

## 2023-08-11 DIAGNOSIS — M25562 Pain in left knee: Secondary | ICD-10-CM | POA: Diagnosis not present

## 2023-08-15 DIAGNOSIS — N941 Unspecified dyspareunia: Secondary | ICD-10-CM | POA: Diagnosis not present

## 2023-08-15 DIAGNOSIS — R3916 Straining to void: Secondary | ICD-10-CM | POA: Diagnosis not present

## 2023-08-15 DIAGNOSIS — R3915 Urgency of urination: Secondary | ICD-10-CM | POA: Diagnosis not present

## 2023-08-15 DIAGNOSIS — N3941 Urge incontinence: Secondary | ICD-10-CM | POA: Diagnosis not present

## 2023-08-15 DIAGNOSIS — R102 Pelvic and perineal pain: Secondary | ICD-10-CM | POA: Diagnosis not present

## 2023-08-23 ENCOUNTER — Telehealth: Payer: Self-pay | Admitting: Family Medicine

## 2023-08-23 NOTE — Telephone Encounter (Signed)
 Copied from CRM [id number]. Topic: Medicare AWV >> Aug 23, 2023  3:35 PM Nathanel DEL wrote: Reason for CRM: LVM 08/23/2023 to schedule AWV. Please schedule Virtual or Telehealth visits ONLY.   Nathanel Paschal; Care Guide Ambulatory Clinical Support [place] [facility] Direct Dial: [phone]

## 2023-08-29 ENCOUNTER — Ambulatory Visit: Payer: Self-pay

## 2023-08-29 ENCOUNTER — Encounter: Payer: Self-pay | Admitting: Family Medicine

## 2023-08-29 DIAGNOSIS — R3915 Urgency of urination: Secondary | ICD-10-CM | POA: Diagnosis not present

## 2023-08-29 DIAGNOSIS — N941 Unspecified dyspareunia: Secondary | ICD-10-CM | POA: Diagnosis not present

## 2023-08-29 DIAGNOSIS — N3941 Urge incontinence: Secondary | ICD-10-CM | POA: Diagnosis not present

## 2023-08-29 DIAGNOSIS — R3916 Straining to void: Secondary | ICD-10-CM | POA: Diagnosis not present

## 2023-08-29 DIAGNOSIS — R102 Pelvic and perineal pain: Secondary | ICD-10-CM | POA: Diagnosis not present

## 2023-08-29 NOTE — Telephone Encounter (Signed)
 FYI Only or Action Required?: Action required by provider: request for appointment.  Patient was last seen in primary care on 06/22/2023 by Antonio Meth, Sherry SAUNDERS, DO.  Called Nurse Triage reporting Dysuria.  Symptoms began several days ago.  Interventions attempted: OTC medications: AZO tablets.  Symptoms are: unchanged.  Triage Disposition: See Physician Within 24 Hours  Patient/caregiver understands and will follow disposition?: YesCopied from CRM [id number]. Topic: Clinical - Red Word Triage >> Aug 29, 2023  9:59 AM Sherry Ross wrote: Red Word that prompted transfer to Nurse Triage: patient called in and stated she is having a burning when she urinates, been going on for 2 days . Asking to bring in a sample Reason for Disposition  Urinating more frequently than usual (i.e., frequency) OR new-onset of the feeling of an urgent need to urinate (i.e., urgency)  Answer Assessment - Initial Assessment Questions 1. SYMPTOM: What's the main symptom you're concerned about? (e.g., frequency, incontinence)     Urgency  2. ONSET: When did the    start?     Sunday night  3. PAIN: Is there any pain? If Yes, ask: How bad is it? (Scale: 1-10; mild, moderate, severe)     denies 4. CAUSE: What do you think is causing the symptoms?     Possible UTI 5. OTHER SYMPTOMS: Do you have any other symptoms? (e.g., blood in urine, fever, flank pain, pain with urination)     burning   Pt had UTI a month ago. Pt was given abx but next day was called and told to throw abx away after pt had taken one tablet.  Pt has same symptoms and has noticed both times are after pt gets off river. Pt is taking AZO.  Protocols used: Urinary Symptoms-A-AH

## 2023-08-30 ENCOUNTER — Encounter: Payer: Self-pay | Admitting: Medical

## 2023-08-30 ENCOUNTER — Ambulatory Visit (INDEPENDENT_AMBULATORY_CARE_PROVIDER_SITE_OTHER): Admitting: Medical

## 2023-08-30 ENCOUNTER — Other Ambulatory Visit (HOSPITAL_COMMUNITY): Payer: Self-pay

## 2023-08-30 ENCOUNTER — Other Ambulatory Visit (HOSPITAL_BASED_OUTPATIENT_CLINIC_OR_DEPARTMENT_OTHER): Payer: Self-pay

## 2023-08-30 VITALS — BP 133/78 | HR 86 | Temp 97.7°F | Resp 18 | Ht 66.0 in | Wt 146.2 lb

## 2023-08-30 DIAGNOSIS — N898 Other specified noninflammatory disorders of vagina: Secondary | ICD-10-CM

## 2023-08-30 DIAGNOSIS — R300 Dysuria: Secondary | ICD-10-CM

## 2023-08-30 DIAGNOSIS — F439 Reaction to severe stress, unspecified: Secondary | ICD-10-CM | POA: Diagnosis not present

## 2023-08-30 LAB — POCT URINALYSIS DIPSTICK
Bilirubin, UA: NEGATIVE
Blood, UA: NEGATIVE
Glucose, UA: NEGATIVE
Ketones, UA: NEGATIVE
Leukocytes, UA: NEGATIVE
Nitrite, UA: POSITIVE
Protein, UA: NEGATIVE
Spec Grav, UA: 1.01 (ref 1.010–1.025)
Urobilinogen, UA: 0.2 U/dL
pH, UA: 6 (ref 5.0–8.0)

## 2023-08-30 MED ORDER — ESTRADIOL 0.1 MG/GM VA CREA
TOPICAL_CREAM | VAGINAL | 1 refills | Status: DC
  Filled 2023-08-30: qty 42.5, 30d supply, fill #0
  Filled 2023-08-30: qty 30, 15d supply, fill #0
  Filled 2023-09-07 – 2023-09-22 (×2): qty 42.5, 30d supply, fill #1

## 2023-08-30 MED ORDER — CEPHALEXIN 500 MG PO CAPS
500.0000 mg | ORAL_CAPSULE | Freq: Two times a day (BID) | ORAL | 0 refills | Status: DC
Start: 2023-08-30 — End: 2023-09-01

## 2023-08-30 NOTE — Patient Instructions (Addendum)
 Recurrent Urinary Tract Infections sign/symptoms (UTIs) Recurrent UTIs with current symptoms of frequency, burning, and urgency. Previous culture negative, suggesting possible interstitial cystitis. Current urinalysis indicates possible infection. Awaiting culture results. - Prescribe Keflex  for 7 days pending culture results. - Advise hydration. - Await culture results and update by Friday or Saturday. - Consider interstitial cystitis if culture negative and evaluate further if needed.  Premarin  Therapy for Atrophy PCP prescribed Premarin . Cost prohibitive, advised to check for lower cost options. - Check with pharmacy downstairs for Premarin  pricing. - If cost remains high, send a MyChart message for alternative options. - Coordinate with Dr. Antonio for alternative for premarin  therapy if needed.  Follow up date to be determined after culture review and update

## 2023-08-30 NOTE — Progress Notes (Signed)
 Subjective:    Patient ID: Sherry Ross, female    DOB: August 09, 1955, 68 y.o.   MRN: [id number]  HPI   Sherry Ross is a 68 year old female who presents with symptoms of a urinary tract infection.  She has been experiencing symptoms of a urinary tract infection since Sunday, including frequent urination, burning sensation, and urgency. No fevers, chills, or sweats are present. She had a similar episode in June after kayaking and took antibiotics. However, a urine culture from last month showed no growth. She is currently experiencing another episode in July.  In March 2023, she had a confirmed urinary tract infection with E. Coli growth. She has been taking Azo for pain relief, which she used on Monday and Tuesday, but not today. She is also undergoing physical therapy for pelvic exercises to help with urinary control.  She mentions a high cost of estrogen medication, Premarin , which she believes is necessary and was recommended by her physical therapist to help with urinary tract infections. She is seeking a more affordable alternative as her copay is $160 per month.       Review of Systems  Constitutional:  Negative for chills, fatigue and fever.  HENT:  Negative for congestion and ear pain.   Respiratory:  Negative for cough, chest tightness, shortness of breath and wheezing.   Cardiovascular:  Negative for chest pain and palpitations.  Gastrointestinal:  Negative for abdominal pain.  Genitourinary:  Positive for dysuria, frequency and urgency. Negative for hematuria.  Musculoskeletal:  Negative for back pain and myalgias.  Skin:  Negative for rash.  Neurological:  Negative for seizures, facial asymmetry, weakness and light-headedness.  Hematological:  Negative for adenopathy. Does not bruise/bleed easily.  Psychiatric/Behavioral:  Negative for behavioral problems, confusion and sleep disturbance.     Past Medical History:  Diagnosis Date   Hyperlipidemia    Hypertension       Social History   Socioeconomic History   Marital status: Married    Spouse name: Not on file   Number of children: Not on file   Years of education: Not on file   Highest education level: Some college, no degree  Occupational History   Occupation: medical billing    Comment: retired  Tobacco Use   Smoking status: Former    Current packs/day: 0.00    Average packs/day: 0.8 packs/day for 4.0 years (3.2 ttl pk-yrs)    Types: Cigarettes    Start date: 02/15/1979    Quit date: 02/15/1983    Years since quitting: 40.5   Smokeless tobacco: Never  Substance and Sexual Activity   Alcohol use: Yes   Drug use: No   Sexual activity: Not Currently  Other Topics Concern   Not on file  Social History Narrative   PICKLE BALL   Social Drivers of Health   Financial Resource Strain: Low Risk  (08/29/2023)   Overall Financial Resource Strain (CARDIA)    Difficulty of Paying Living Expenses: Not hard at all  Food Insecurity: No Food Insecurity (08/29/2023)   Hunger Vital Sign    Worried About Running Out of Food in the Last Year: Never true    Ran Out of Food in the Last Year: Never true  Transportation Needs: No Transportation Needs (08/29/2023)   PRAPARE - Administrator, Civil Service (Medical): No    Lack of Transportation (Non-Medical): No  Physical Activity: Sufficiently Active (08/29/2023)   Exercise Vital Sign    Days of Exercise per Week:  6 days    Minutes of Exercise per Session: 90 min  Stress: No Stress Concern Present (08/29/2023)   [employer] of Occupational Health - Occupational Stress Questionnaire    Feeling of Stress: Not at all  Recent Concern: Stress - Stress Concern Present (06/21/2023)   [employer] of Occupational Health - Occupational Stress Questionnaire    Feeling of Stress : Very much  Social Connections: Socially Integrated (08/29/2023)   Social Connection and Isolation Panel    Frequency of Communication with Friends and Family: Once a  week    Frequency of Social Gatherings with Friends and Family: Three times a week    Attends Religious Services: More than 4 times per year    Active Member of Clubs or Organizations: Yes    Attends Banker Meetings: More than 4 times per year    Marital Status: Married  Catering manager Violence: Not At Risk (06/03/2022)   Humiliation, Afraid, Rape, and Kick questionnaire    Fear of Current or Ex-Partner: No    Emotionally Abused: No    Physically Abused: No    Sexually Abused: No    Past Surgical History:  Procedure Laterality Date   ABDOMINAL HYSTERECTOMY  02/14/1993   acl repair Left    [facility]   CHOLECYSTECTOMY  02/14/1997   KNEE CARTILAGE SURGERY Left    orbera      Family History  Problem Relation Age of Onset   Uterine cancer Mother    Breast cancer Mother    Alcohol abuse Father    Heart disease Brother    Hypertension Brother    Diabetes Brother     No Known Allergies  Current Outpatient Medications on File Prior to Visit  Medication Sig Dispense Refill   amitriptyline (ELAVIL) 25 MG tablet Take 25 mg by mouth at bedtime.     rosuvastatin  (CRESTOR ) 10 MG tablet TAKE 1 TABLET BY MOUTH EVERY OTHER DAY 45 tablet 1   No current facility-administered medications on file prior to visit.    BP (!) 142/78   Pulse 86   Temp 97.7 F (36.5 C)   Resp 18   Ht 5' 6 (1.676 m)   Wt 146 lb 3.2 oz (66.3 kg)   LMP 02/14/1993   SpO2 98%   BMI 23.60 kg/m    133/78 on recheck       Objective:   Physical Exam  General Mental Status- Alert. General Appearance- Not in acute distress.   Skin General: Color- Normal Color. Moisture- Normal Moisture.  Neck  No carotid bruits. No JVD.  Chest and Lung Exam Auscultation: Breath Sounds:-Normal.  Cardiovascular Auscultation:Rythm- Regular. Murmurs & Other Heart Sounds:Auscultation of the heart reveals- No Murmurs.  Abdomen Inspection:-Inspeection Normal. Palpation/Percussion:Note:No mass.  Palpation and Percussion of the abdomen reveal- faint suprapubic Tenderness, Non Distended + BS, no rebound or guarding.   Neurologic Cranial Nerve exam:- CN III-XII intact(No nystagmus), symmetric smile. Strength:- 5/5 equal and symmetric strength both upper and lower extremities.   Back- no cva tenderness.    Assessment & Plan:   Patient Instructions  Recurrent Urinary Tract Infections sign/symptoms (UTIs) Recurrent UTIs with current symptoms of frequency, burning, and urgency. Previous culture negative, suggesting possible interstitial cystitis. Current urinalysis indicates possible infection. Awaiting culture results. - Prescribe Keflex  for 7 days pending culture results. - Advise hydration. - Await culture results and update by Friday or Saturday. - Consider interstitial cystitis if culture negative and evaluate further if needed.  Premarin  Therapy for  Atrophy PCP prescribed Premarin . Cost prohibitive, advised to check for lower cost options. - Check with pharmacy downstairs for Premarin  pricing. - If cost remains high, send a MyChart message for alternative options. - Coordinate with Dr. Antonio for alternative for premarin  therapy if needed.  Follow up date to be determined after culture review and update    Dallas Maxwell, PA-C

## 2023-08-31 ENCOUNTER — Ambulatory Visit: Payer: Self-pay | Admitting: Medical

## 2023-09-01 ENCOUNTER — Other Ambulatory Visit (HOSPITAL_BASED_OUTPATIENT_CLINIC_OR_DEPARTMENT_OTHER): Payer: Self-pay

## 2023-09-01 LAB — URINE CULTURE
MICRO NUMBER:: 16706379
SPECIMEN QUALITY:: ADEQUATE

## 2023-09-01 MED ORDER — AMOXICILLIN-POT CLAVULANATE 875-125 MG PO TABS
1.0000 | ORAL_TABLET | Freq: Two times a day (BID) | ORAL | 0 refills | Status: DC
  Filled 2023-09-01: qty 14, 7d supply, fill #0

## 2023-09-01 NOTE — Addendum Note (Signed)
 Addended by: DORINA DALLAS HERO on: 09/01/2023 01:06 PM   Modules accepted: Orders

## 2023-09-07 ENCOUNTER — Other Ambulatory Visit (HOSPITAL_BASED_OUTPATIENT_CLINIC_OR_DEPARTMENT_OTHER): Payer: Self-pay

## 2023-09-07 DIAGNOSIS — H903 Sensorineural hearing loss, bilateral: Secondary | ICD-10-CM | POA: Diagnosis not present

## 2023-09-21 ENCOUNTER — Other Ambulatory Visit: Payer: Self-pay | Admitting: Family Medicine

## 2023-09-21 ENCOUNTER — Encounter: Payer: Self-pay | Admitting: Family Medicine

## 2023-09-21 DIAGNOSIS — R748 Abnormal levels of other serum enzymes: Secondary | ICD-10-CM

## 2023-09-22 ENCOUNTER — Other Ambulatory Visit (HOSPITAL_BASED_OUTPATIENT_CLINIC_OR_DEPARTMENT_OTHER): Payer: Self-pay

## 2023-09-26 ENCOUNTER — Ambulatory Visit: Payer: Self-pay | Admitting: Family Medicine

## 2023-09-26 ENCOUNTER — Other Ambulatory Visit (INDEPENDENT_AMBULATORY_CARE_PROVIDER_SITE_OTHER)

## 2023-09-26 DIAGNOSIS — R3916 Straining to void: Secondary | ICD-10-CM | POA: Diagnosis not present

## 2023-09-26 DIAGNOSIS — R102 Pelvic and perineal pain: Secondary | ICD-10-CM | POA: Diagnosis not present

## 2023-09-26 DIAGNOSIS — R748 Abnormal levels of other serum enzymes: Secondary | ICD-10-CM | POA: Diagnosis not present

## 2023-09-26 DIAGNOSIS — N941 Unspecified dyspareunia: Secondary | ICD-10-CM | POA: Diagnosis not present

## 2023-09-26 DIAGNOSIS — N3941 Urge incontinence: Secondary | ICD-10-CM | POA: Diagnosis not present

## 2023-09-26 DIAGNOSIS — R3915 Urgency of urination: Secondary | ICD-10-CM | POA: Diagnosis not present

## 2023-09-26 LAB — COMPREHENSIVE METABOLIC PANEL WITH GFR
ALT: 33 U/L (ref 0–35)
AST: 31 U/L (ref 0–37)
Albumin: 4.3 g/dL (ref 3.5–5.2)
Alkaline Phosphatase: 49 U/L (ref 39–117)
BUN: 12 mg/dL (ref 6–23)
CO2: 30 meq/L (ref 19–32)
Calcium: 9 mg/dL (ref 8.4–10.5)
Chloride: 103 meq/L (ref 96–112)
Creatinine, Ser: 0.5 mg/dL (ref 0.40–1.20)
GFR: 95.91 mL/min (ref >60.00)
Glucose, Bld: 98 mg/dL (ref 70–99)
Potassium: 4.3 meq/L (ref 3.5–5.1)
Sodium: 141 meq/L (ref 135–145)
Total Bilirubin: 0.7 mg/dL (ref 0.2–1.2)
Total Protein: 6.7 g/dL (ref 6.0–8.3)

## 2023-10-04 DIAGNOSIS — R3916 Straining to void: Secondary | ICD-10-CM | POA: Diagnosis not present

## 2023-10-04 DIAGNOSIS — R3915 Urgency of urination: Secondary | ICD-10-CM | POA: Diagnosis not present

## 2023-10-04 DIAGNOSIS — R102 Pelvic and perineal pain: Secondary | ICD-10-CM | POA: Diagnosis not present

## 2023-10-04 DIAGNOSIS — N941 Unspecified dyspareunia: Secondary | ICD-10-CM | POA: Diagnosis not present

## 2023-10-04 DIAGNOSIS — N3941 Urge incontinence: Secondary | ICD-10-CM | POA: Diagnosis not present

## 2023-10-09 DIAGNOSIS — R102 Pelvic and perineal pain: Secondary | ICD-10-CM | POA: Diagnosis not present

## 2023-10-09 DIAGNOSIS — R3915 Urgency of urination: Secondary | ICD-10-CM | POA: Diagnosis not present

## 2023-10-09 DIAGNOSIS — N941 Unspecified dyspareunia: Secondary | ICD-10-CM | POA: Diagnosis not present

## 2023-10-09 DIAGNOSIS — N3941 Urge incontinence: Secondary | ICD-10-CM | POA: Diagnosis not present

## 2023-10-09 DIAGNOSIS — R3916 Straining to void: Secondary | ICD-10-CM | POA: Diagnosis not present

## 2023-10-11 DIAGNOSIS — F439 Reaction to severe stress, unspecified: Secondary | ICD-10-CM | POA: Diagnosis not present

## 2023-10-12 DIAGNOSIS — N941 Unspecified dyspareunia: Secondary | ICD-10-CM | POA: Diagnosis not present

## 2023-10-12 DIAGNOSIS — R102 Pelvic and perineal pain: Secondary | ICD-10-CM | POA: Diagnosis not present

## 2023-10-12 DIAGNOSIS — N3941 Urge incontinence: Secondary | ICD-10-CM | POA: Diagnosis not present

## 2023-10-12 DIAGNOSIS — R3916 Straining to void: Secondary | ICD-10-CM | POA: Diagnosis not present

## 2023-10-12 DIAGNOSIS — R3915 Urgency of urination: Secondary | ICD-10-CM | POA: Diagnosis not present

## 2023-10-25 ENCOUNTER — Ambulatory Visit

## 2023-10-30 DIAGNOSIS — R102 Pelvic and perineal pain: Secondary | ICD-10-CM | POA: Diagnosis not present

## 2023-10-30 DIAGNOSIS — N941 Unspecified dyspareunia: Secondary | ICD-10-CM | POA: Diagnosis not present

## 2023-10-30 DIAGNOSIS — R3915 Urgency of urination: Secondary | ICD-10-CM | POA: Diagnosis not present

## 2023-10-30 DIAGNOSIS — R3916 Straining to void: Secondary | ICD-10-CM | POA: Diagnosis not present

## 2023-10-30 DIAGNOSIS — N3941 Urge incontinence: Secondary | ICD-10-CM | POA: Diagnosis not present

## 2023-11-01 DIAGNOSIS — H524 Presbyopia: Secondary | ICD-10-CM | POA: Diagnosis not present

## 2023-11-02 DIAGNOSIS — F439 Reaction to severe stress, unspecified: Secondary | ICD-10-CM | POA: Diagnosis not present

## 2023-11-07 DIAGNOSIS — H2513 Age-related nuclear cataract, bilateral: Secondary | ICD-10-CM | POA: Diagnosis not present

## 2023-11-07 DIAGNOSIS — H25043 Posterior subcapsular polar age-related cataract, bilateral: Secondary | ICD-10-CM | POA: Diagnosis not present

## 2023-11-07 DIAGNOSIS — H18413 Arcus senilis, bilateral: Secondary | ICD-10-CM | POA: Diagnosis not present

## 2023-11-07 DIAGNOSIS — H2512 Age-related nuclear cataract, left eye: Secondary | ICD-10-CM | POA: Diagnosis not present

## 2023-11-07 DIAGNOSIS — H25013 Cortical age-related cataract, bilateral: Secondary | ICD-10-CM | POA: Diagnosis not present

## 2023-11-23 DIAGNOSIS — L570 Actinic keratosis: Secondary | ICD-10-CM | POA: Diagnosis not present

## 2023-11-23 DIAGNOSIS — Z85828 Personal history of other malignant neoplasm of skin: Secondary | ICD-10-CM | POA: Diagnosis not present

## 2023-11-23 DIAGNOSIS — Z08 Encounter for follow-up examination after completed treatment for malignant neoplasm: Secondary | ICD-10-CM | POA: Diagnosis not present

## 2023-11-23 DIAGNOSIS — L814 Other melanin hyperpigmentation: Secondary | ICD-10-CM | POA: Diagnosis not present

## 2023-11-23 DIAGNOSIS — D1801 Hemangioma of skin and subcutaneous tissue: Secondary | ICD-10-CM | POA: Diagnosis not present

## 2023-11-23 DIAGNOSIS — Z85820 Personal history of malignant melanoma of skin: Secondary | ICD-10-CM | POA: Diagnosis not present

## 2023-11-23 DIAGNOSIS — L821 Other seborrheic keratosis: Secondary | ICD-10-CM | POA: Diagnosis not present

## 2023-11-27 ENCOUNTER — Encounter: Payer: Self-pay | Admitting: Medical

## 2023-11-27 ENCOUNTER — Ambulatory Visit: Payer: Self-pay

## 2023-11-27 ENCOUNTER — Encounter: Payer: Self-pay | Admitting: Family Medicine

## 2023-11-27 DIAGNOSIS — E785 Hyperlipidemia, unspecified: Secondary | ICD-10-CM

## 2023-11-27 DIAGNOSIS — R300 Dysuria: Secondary | ICD-10-CM

## 2023-11-27 NOTE — Telephone Encounter (Signed)
 FYI Only or Action Required?: Action required by provider: request for appointment and clinical question for provider.  Patient was last seen in primary care on 08/30/2023 by Dorina Loving, PA-C.  Called Nurse Triage reporting Urinary Tract Infection.  Symptoms began today.  Interventions attempted: Nothing.  Symptoms are: gradually worsening.  Triage Disposition: See Physician Within 24 Hours, Call PCP Within 24 Hours  Patient/caregiver understands and will follow disposition?: No, wishes to speak with PCP  Copied from CRM #[id number]. Topic: Clinical - Red Word Triage >> Nov 27, 2023  4:40 PM Corin V wrote: [facility] that prompted transfer to Nurse Triage: Patient is having burning sensation when peeing and feels she has another urinary tract infection. She has them frequently and thinks the last one was about 3 months ago. She wants to get labs/a urinalysis  if possible Reason for Disposition  All other patients with painful urination  (Exception: [1] EITHER frequency or urgency AND [2] has on-call doctor.)  Answer Assessment - Initial Assessment Questions No available appts today/24hour. Advised UC now and ED if symptoms worsen.  Pt declined UC and requests work in visit tomorrow morning/call back.  1. SEVERITY: How bad is the pain?  (e.g., Scale 1-10; mild, moderate, or severe)     3/10; burning sensation 2. FREQUENCY: How many times have you had painful urination today?      Urge; x3 3. PATTERN: Is pain present every time you urinate or just sometimes?      Was only with urination, but now it's constant 4. ONSET: When did the painful urination start?      today 5. FEVER: Do you have a fever? If Yes, ask: What is your temperature, how was it measured, and when did it start?     denies 6. PAST UTI: Have you had a urine infection before? If Yes, ask: When was the last time? and What happened that time?      yes 7. CAUSE: What do you think is causing the  painful urination?  (e.g., UTI, scratch, Herpes sore)     uti 8. OTHER SYMPTOMS: Do you have any other symptoms? (e.g., blood in urine, flank pain, genital sores, urgency, vaginal discharge)     Denies blood, odor, back and abd pain, vag discharge  Protocols used: Urination Pain - Female-A-AH

## 2023-11-28 ENCOUNTER — Ambulatory Visit: Admitting: Family Medicine

## 2023-11-28 ENCOUNTER — Encounter: Payer: Self-pay | Admitting: Family Medicine

## 2023-11-28 ENCOUNTER — Other Ambulatory Visit (HOSPITAL_BASED_OUTPATIENT_CLINIC_OR_DEPARTMENT_OTHER): Payer: Self-pay

## 2023-11-28 VITALS — BP 130/80 | HR 61 | Temp 97.8°F | Resp 16 | Ht 66.0 in | Wt 137.8 lb

## 2023-11-28 DIAGNOSIS — R300 Dysuria: Secondary | ICD-10-CM | POA: Insufficient documentation

## 2023-11-28 DIAGNOSIS — N3941 Urge incontinence: Secondary | ICD-10-CM | POA: Insufficient documentation

## 2023-11-28 DIAGNOSIS — N390 Urinary tract infection, site not specified: Secondary | ICD-10-CM | POA: Diagnosis not present

## 2023-11-28 DIAGNOSIS — R319 Hematuria, unspecified: Secondary | ICD-10-CM

## 2023-11-28 DIAGNOSIS — K580 Irritable bowel syndrome with diarrhea: Secondary | ICD-10-CM

## 2023-11-28 LAB — POC URINALSYSI DIPSTICK (AUTOMATED)
Blood, UA: NEGATIVE
Glucose, UA: POSITIVE — AB
Ketones, UA: POSITIVE
Nitrite, UA: POSITIVE
Protein, UA: NEGATIVE
Spec Grav, UA: 1.01 (ref 1.010–1.025)
Urobilinogen, UA: 1 U/dL
pH, UA: 6 (ref 5.0–8.0)

## 2023-11-28 MED ORDER — MIRABEGRON ER 50 MG PO TB24
50.0000 mg | ORAL_TABLET | Freq: Every day | ORAL | 2 refills | Status: DC
  Filled 2023-11-28: qty 30, 30d supply, fill #0

## 2023-11-28 MED ORDER — CEPHALEXIN 500 MG PO CAPS
500.0000 mg | ORAL_CAPSULE | Freq: Two times a day (BID) | ORAL | 0 refills | Status: DC
  Filled 2023-11-28: qty 20, 10d supply, fill #0
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
## 2023-11-28 NOTE — Telephone Encounter (Signed)
I left a voicemail to schedule.

## 2023-11-28 NOTE — Assessment & Plan Note (Signed)
Per gi  

## 2023-11-28 NOTE — Telephone Encounter (Signed)
 Orders have been placed. Pt needs a lab appointment please

## 2023-11-28 NOTE — Telephone Encounter (Signed)
 Appt scheduled for today.

## 2023-11-28 NOTE — Assessment & Plan Note (Signed)
 Keflex  bid x 10 days  Culture is pending

## 2023-11-29 ENCOUNTER — Other Ambulatory Visit: Payer: Self-pay | Admitting: Family Medicine

## 2023-11-29 DIAGNOSIS — N3941 Urge incontinence: Secondary | ICD-10-CM

## 2023-11-29 MED ORDER — ROSUVASTATIN CALCIUM 10 MG PO TABS: 10.0000 mg | ORAL_TABLET | ORAL | 1 refills | Status: DC

## 2023-11-29 MED ORDER — OXYBUTYNIN CHLORIDE ER 10 MG PO TB24: 10.0000 mg | ORAL_TABLET | Freq: Every day | ORAL | 2 refills | Status: DC

## 2023-11-30 ENCOUNTER — Ambulatory Visit: Admitting: Family Medicine

## 2023-12-01 ENCOUNTER — Ambulatory Visit: Payer: Self-pay | Admitting: Family Medicine

## 2023-12-01 LAB — URINE CULTURE
MICRO NUMBER:: 17096618
SPECIMEN QUALITY:: ADEQUATE

## 2023-12-04 ENCOUNTER — Other Ambulatory Visit: Payer: Self-pay | Admitting: Family Medicine

## 2023-12-04 DIAGNOSIS — N3941 Urge incontinence: Secondary | ICD-10-CM

## 2023-12-14 DIAGNOSIS — F439 Reaction to severe stress, unspecified: Secondary | ICD-10-CM | POA: Diagnosis not present

## 2023-12-16 HISTORY — PX: CATARACT EXTRACTION: SUR2

## 2023-12-25 DIAGNOSIS — H5371 Glare sensitivity: Secondary | ICD-10-CM | POA: Diagnosis not present

## 2023-12-25 DIAGNOSIS — H2511 Age-related nuclear cataract, right eye: Secondary | ICD-10-CM | POA: Diagnosis not present

## 2023-12-26 DIAGNOSIS — H25042 Posterior subcapsular polar age-related cataract, left eye: Secondary | ICD-10-CM | POA: Diagnosis not present

## 2023-12-26 DIAGNOSIS — H2512 Age-related nuclear cataract, left eye: Secondary | ICD-10-CM | POA: Diagnosis not present

## 2023-12-26 DIAGNOSIS — H25012 Cortical age-related cataract, left eye: Secondary | ICD-10-CM | POA: Diagnosis not present

## 2024-01-08 DIAGNOSIS — H2512 Age-related nuclear cataract, left eye: Secondary | ICD-10-CM | POA: Diagnosis not present

## 2024-01-08 DIAGNOSIS — H5371 Glare sensitivity: Secondary | ICD-10-CM | POA: Diagnosis not present

## 2024-01-09 ENCOUNTER — Other Ambulatory Visit (HOSPITAL_BASED_OUTPATIENT_CLINIC_OR_DEPARTMENT_OTHER): Payer: Self-pay

## 2024-01-09 ENCOUNTER — Encounter: Payer: Self-pay | Admitting: Family Medicine

## 2024-01-09 ENCOUNTER — Other Ambulatory Visit: Payer: Self-pay | Admitting: *Deleted

## 2024-01-09 MED ORDER — ESTRADIOL 0.01 % VA CREA
1.0000 | TOPICAL_CREAM | Freq: Every day | VAGINAL | 2 refills | Status: AC
  Filled 2024-01-09: qty 42.5, 30d supply, fill #0

## 2024-01-24 ENCOUNTER — Encounter: Payer: Self-pay | Admitting: Obstetrics and Gynecology

## 2024-01-24 ENCOUNTER — Ambulatory Visit: Admitting: Obstetrics and Gynecology

## 2024-01-24 VITALS — BP 131/85 | HR 65 | Ht 64.0 in | Wt 135.4 lb

## 2024-01-24 DIAGNOSIS — N941 Unspecified dyspareunia: Secondary | ICD-10-CM | POA: Insufficient documentation

## 2024-01-24 DIAGNOSIS — R350 Frequency of micturition: Secondary | ICD-10-CM | POA: Diagnosis not present

## 2024-01-24 DIAGNOSIS — N393 Stress incontinence (female) (male): Secondary | ICD-10-CM | POA: Diagnosis not present

## 2024-01-24 DIAGNOSIS — N3281 Overactive bladder: Secondary | ICD-10-CM | POA: Diagnosis not present

## 2024-01-24 DIAGNOSIS — Z87440 Personal history of urinary (tract) infections: Secondary | ICD-10-CM | POA: Diagnosis not present

## 2024-01-24 DIAGNOSIS — N390 Urinary tract infection, site not specified: Secondary | ICD-10-CM | POA: Insufficient documentation

## 2024-01-24 LAB — POCT URINALYSIS DIP (CLINITEK)
Blood, UA: NEGATIVE
Glucose, UA: NEGATIVE mg/dL
Leukocytes, UA: NEGATIVE
Nitrite, UA: NEGATIVE
POC PROTEIN,UA: NEGATIVE
Spec Grav, UA: 1.025 (ref 1.010–1.025)
Urobilinogen, UA: 2 U/dL — AB
pH, UA: 6 (ref 5.0–8.0)

## 2024-01-24 MED ORDER — TROSPIUM CHLORIDE 20 MG PO TABS: 20.0000 mg | ORAL_TABLET | Freq: Two times a day (BID) | ORAL | 5 refills | Status: AC

## 2024-01-24 NOTE — Progress Notes (Signed)
 New Patient Evaluation and Consultation  Referring Provider: Antonio Meth, Jamee SAUNDERS, * PCP: Antonio Meth, Jamee SAUNDERS, DO Date of Service: 01/24/2024  SUBJECTIVE Chief Complaint: New Patient (Initial Visit) Sherry Ross is a 68 y.o. female is here for urinary urge incontinence.)  History of Present Illness: Sherry Ross is a 68 y.o. White or Caucasian female seen in consultation at the request of Dr Antonio Meth for evaluation of incontinence. .     Urinary Symptoms: Leaks urine with cough/ sneeze, laughing, with a full bladder, and with urgency Leaks 2 time(s) per day- usually large amount with urgency.  Pad use: 1 liners/ mini-pads per day.   Patient is bothered by UI symptoms. She has done pelvic PT at [facility] in [facility]. This did help a bit with holding her bladder.  Her PCP prescribed oxybutynin  but she did not take it because she already has dry mouth. Had taken many years ago as well.  Myrbetriq  was also ordered but not covered by her insurance.   Day time voids 3.  Nocturia: 1 times per night to void. Voiding dysfunction:  empties bladder well.  Patient does not use a catheter to empty bladder.  When urinating, patient feels a weak stream Drinks: 32oz unsweet decaf iced tea with a lot of lemon, 2-3 bottles water, sometimes a beer after pickleball, 2 glasses wine at night  UTIs: 3 UTI's in the last year.   Denies history of blood in urine and kidney or bladder stones She started vaginal estrogen cream two months ago. She is using it 3 times per week.   Susceptibility data from last 90 days. Collected Specimen Info Organism AMOXICILLIN /CLAVULANATE AMPICILLIN/SULBACTAM CEFAZOLIN CEFEPIME Ceftazidime CEFTRIAXONE Ciprofloxacin  Gentamicin Susc lslt Imipenem LEVOFLOXACIN Meropenem  11/28/23 Urine Escherichia coli  S  S  NR  S  S  S  S  S  S  S  S   Collected Specimen Info Organism Nitrofurantoin  Susc lslt Piperacillin + Tazobactam Trimethoprim/Sulfa  11/28/23 Urine  Escherichia coli  S  S  S    Pelvic Organ Prolapse Symptoms:                  Patient Denies a feeling of a bulge the vaginal area.   Bowel Symptom: Bowel movements: 1-2 time(s) per week Stool consistency: hard Straining: yes.  Splinting: yes.  Incomplete evacuation: yes.  Patient Denies accidental bowel leakage / fecal incontinence Bowel regimen: diet, fiber, stool softener, and miralax. Takes amitriptyline for diarrhea prevention- she sometimes will skip this and use stool softener if needed.   HM Colonoscopy          Upcoming     Colonoscopy (Every 10 Years) Next due on 08/28/2029    08/29/2019  Outside Procedure: PR COLONOSCOPY FLX DX W/COLLJ SPEC WHEN PFRMD   Only the first 1 history entries have been loaded, but more history exists.                Sexual Function Sexually active: yes Lots of pain with intercourse. Uses KY Jelly. Has more pain internally.  Any position is uncomfortable  Pelvic Pain Denies pelvic pain   Past Medical History:  Past Medical History:  Diagnosis Date   Hyperlipidemia    Hypertension      Past Surgical History:   Past Surgical History:  Procedure Laterality Date   ABDOMINAL HYSTERECTOMY  02/14/1993   acl repair Left    beane   CATARACT EXTRACTION Bilateral 12/2023   CHOLECYSTECTOMY  02/14/1997  KNEE CARTILAGE SURGERY Left    arnaldo       Past OB/GYN History: OB History  Gravida Para Term Preterm AB Living  0 0 0 0 0 0  SAB IAB Ectopic Multiple Live Births  0 0 0 0 0    S/p hysterectomy   Medications: Patient has a current medication list which includes the following prescription(s): amitriptyline, chromium-cinnamon, estradiol , rosuvastatin , trospium, turmeric, biotin, and multiple vitamins-minerals.   Allergies: Patient has no known allergies.   Social History:  Social History   Tobacco Use   Smoking status: Former    Current packs/day: 0.00    Average packs/day: 0.8 packs/day for 4.0 years (3.2 ttl  pk-yrs)    Types: Cigarettes    Start date: 02/15/1979    Quit date: 02/15/1983    Years since quitting: 40.9   Smokeless tobacco: Never  Vaping Use   Vaping status: Never Used  Substance Use Topics   Alcohol use: Yes   Drug use: No    Relationship status: married, currently separated Patient lives alone Patient is not employed. Regular exercise: Yes: pickleball 3-4 times per week History of abuse: No  Family History:   Family History  Problem Relation Age of Onset   Uterine cancer Mother    Breast cancer Mother    Alcohol abuse Father    Heart disease Brother    Hypertension Brother    Diabetes Brother      Review of Systems: Review of Systems  Constitutional:  Negative for fever, malaise/fatigue and weight loss.  Respiratory:  Negative for cough, shortness of breath and wheezing.   Cardiovascular:  Negative for chest pain, palpitations and leg swelling.  Gastrointestinal:  Negative for abdominal pain and blood in stool.  Genitourinary:  Negative for dysuria.  Musculoskeletal:  Negative for myalgias.  Skin:  Negative for rash.  Neurological:  Positive for dizziness. Negative for headaches.  Endo/Heme/Allergies:  Bruises/bleeds easily.  Psychiatric/Behavioral:  Negative for depression. The patient is nervous/anxious.      OBJECTIVE Physical Exam: Vitals:   01/24/24 0914  BP: 131/85  Pulse: 65  Weight: 135 lb 6.4 oz (61.4 kg)  Height: 5' 4 (1.626 m)    Physical Exam Vitals reviewed. Exam conducted with a chaperone present.  Constitutional:      General: She is not in acute distress. Pulmonary:     Effort: Pulmonary effort is normal.  Abdominal:     General: There is no distension.     Palpations: Abdomen is soft.     Tenderness: There is no abdominal tenderness. There is no rebound.  Musculoskeletal:        General: No swelling. Normal range of motion.  Skin:    General: Skin is warm and dry.     Findings: No rash.  Neurological:     Mental Status:  She is alert and oriented to person, place, and time.  Psychiatric:        Mood and Affect: Mood normal.        Behavior: Behavior normal.      GU / Detailed Urogynecologic Evaluation:  Pelvic Exam: Normal external female genitalia; Bartholin's and Skene's glands normal in appearance; urethral meatus normal in appearance, no urethral masses or discharge.   CST: negative  s/p hysterectomy: Speculum exam reveals normal vaginal mucosa with  atrophy and normal vaginal cuff.  Adnexa no mass, fullness, tenderness.     Pelvic floor strength I/V  Pelvic floor musculature: Right levator non-tender, Right obturator tender, Left  levator tender, Left obturator tender  POP-Q:   POP-Q  -3                                            Aa   -3                                           Ba  -8                                              C   2.5                                            Gh  3.5                                            Pb  8                                            tvl   -3                                            Ap  -3                                            Bp                                                 D      Rectal Exam:  deferred  Post-Void Residual (PVR) by Bladder Scan: In order to evaluate bladder emptying, we discussed obtaining a postvoid residual and patient agreed to this procedure.  Procedure: The ultrasound unit was placed on the patient's abdomen in the suprapubic region after the patient had voided.    Post Void Residual - 01/24/24 0925       Post Void Residual   Post Void Residual 81 mL           Laboratory Results: Lab Results  Component Value Date   COLORU yellow 01/24/2024   CLARITYU clear 01/24/2024   GLUCOSEUR negative 01/24/2024   BILIRUBINUR small (A) 01/24/2024   KETONESU Positive 11/28/2023   SPECGRAV 1.025 01/24/2024   RBCUR negative 01/24/2024   PHUR 6.0 01/24/2024   PROTEINUR Negative 11/28/2023    UROBILINOGEN 2.0 (A) 01/24/2024   LEUKOCYTESUR Negative 01/24/2024    Lab Results  Component Value Date   CREATININE 0.50 09/26/2023  CREATININE 0.62 06/22/2023   CREATININE 0.47 05/29/2023    Lab Results  Component Value Date   HGBA1C 5.9 07/18/2018    Lab Results  Component Value Date   HGB 12.9 06/22/2023     ASSESSMENT AND PLAN Ms. Mosteller is a 68 y.o. with:  1. Overactive bladder   2. Urinary frequency   3. Recurrent urinary tract infection   4. SUI (stress urinary incontinence, female)   5. Dyspareunia, female     Overactive bladder Assessment & Plan: We discussed the symptoms of overactive bladder (OAB), which include urinary urgency, urinary frequency, nocturia, with or without urge incontinence.  While we do not know the exact etiology of OAB, several treatment options exist. We discussed management including behavioral therapy (decreasing bladder irritants, urge suppression strategies, timed voids, bladder retraining), physical therapy, medication; for refractory cases posterior tibial nerve stimulation, sacral neuromodulation, and intravesical botulinum toxin injection.  - Prescribed trospium 20mg  BID. For anticholinergic medications, we discussed the potential side effects of anticholinergics including dry eyes, dry mouth, constipation, cognitive impairment and urinary retention. - Advised to avoid bladder irritants such as citrus, tea and alcohol. List provided   Orders: -     Trospium Chloride; Take 1 tablet (20 mg total) by mouth 2 (two) times daily.  Dispense: 60 tablet; Refill: 5  Urinary frequency -     POCT URINALYSIS DIP (CLINITEK)  Recurrent urinary tract infection Assessment & Plan: - UTIs in the last year but just started using vaginal estrogen cream 2 months ago.  - Recommended continuing with vaginal estrogen 3 times per week    SUI (stress urinary incontinence, female) Assessment & Plan: - Has more rare SUI, not bothered by this and  wants to defer treatment   Dyspareunia, female Assessment & Plan: - Suspect some of her dyspareunia is due to high tone pelvic floor.  - We discussed option of returning to pelvic physical therapy, muscle relaxants and self pelvic floor massage with or without a pelvic wand. She is interested in using a pelvic wand- information provided.  - Also advised to use daily vaginal moisturizer in addition to estrogen cream three times a week. Recommended silicone based lubricant for intercourse   Return 6 weeks   Rosaline LOISE Caper, MD

## 2024-01-24 NOTE — Assessment & Plan Note (Signed)
-   Has more rare SUI, not bothered by this and wants to defer treatment

## 2024-01-24 NOTE — Assessment & Plan Note (Signed)
 We discussed the symptoms of overactive bladder (OAB), which include urinary urgency, urinary frequency, nocturia, with or without urge incontinence.  While we do not know the exact etiology of OAB, several treatment options exist. We discussed management including behavioral therapy (decreasing bladder irritants, urge suppression strategies, timed voids, bladder retraining), physical therapy, medication; for refractory cases posterior tibial nerve stimulation, sacral neuromodulation, and intravesical botulinum toxin injection.  - Prescribed trospium 20mg  BID. For anticholinergic medications, we discussed the potential side effects of anticholinergics including dry eyes, dry mouth, constipation, cognitive impairment and urinary retention. - Advised to avoid bladder irritants such as citrus, tea and alcohol. List provided

## 2024-01-24 NOTE — Assessment & Plan Note (Signed)
-   UTIs in the last year but just started using vaginal estrogen cream 2 months ago.  - Recommended continuing with vaginal estrogen 3 times per week

## 2024-01-24 NOTE — Assessment & Plan Note (Addendum)
-   Suspect some of her dyspareunia is due to high tone pelvic floor.  - We discussed option of returning to pelvic physical therapy, muscle relaxants and self pelvic floor massage with or without a pelvic wand. She is interested in using a pelvic wand- information provided.  - Also advised to use daily vaginal moisturizer in addition to estrogen cream three times a week. Recommended silicone based lubricant for intercourse

## 2024-01-24 NOTE — Patient Instructions (Addendum)
 Vulvovaginal moisturizer Options: Vitamin E oil (pump or capsule) or cream (Gene's Vit E Cream) Coconut oil Silicone-based lubricant for use during intercourse (uberlube or wet platinum is a brand available at most drugstores) Consider the ingredients of the product - the fewer the ingredients the better!  Directions for Use: Clean and dry your hands Gently dab the vulvar/vaginal area dry as needed Apply a pea-sized amount of the moisturizer onto your fingertip Using you other hand, open the labia  Apply the moisturizer to the vulvar/vaginal tissues Wear loose fitting underwear/clothing if possible following application Use moisturize up to 3 times daily as desired.  Today we talked about ways to manage bladder urgency such as altering your diet to avoid irritative beverages and foods (bladder diet) as well as attempting to decrease stress and other exacerbating factors.    The Most Bothersome Foods* The Least Bothersome Foods*  Coffee - Regular & Decaf Tea - caffeinated Carbonated beverages - cola, non-colas, diet & caffeine-free Alcohols - Beer, Red Wine, White Wine, [address] - Grapefruit, [place], Orange, [employer] - Cranberry, Grapefruit, Orange, Pineapple Vegetables - Tomato & Tomato Products Flavor Enhancers - Hot peppers, Spicy foods, Chili, Horseradish, Vinegar, Monosodium glutamate (MSG) Artificial Sweeteners - NutraSweet, Sweet 'N Low, Equal (sweetener), Saccharin Ethnic foods - Mexican, Thai, Indian food [employer] - low-fat & whole Fruits - Bananas, Blueberries, Honeydew melon, Pears, Raisins, Watermelon Vegetables - Broccoli, [address] Sprouts, [place], Carrots, Cauliflower, [place], Cucumber, Mushrooms, Peas, Radishes, Squash, Zucchini, White potatoes, Sweet potatoes & yams Poultry - Chicken, Eggs, Turkey, [employer] - Beef, [employer], Lamb Seafood - Shrimp, [place] fish, Salmon Grains - Oat, Rice Snacks - Pretzels, Popcorn  *Mitch ALF et al. Diet and  its role in interstitial cystitis/bladder pain syndrome (IC/BPS) and comorbid conditions. [employer]. BJU Int. 2012 Jan 11.

## 2024-03-06 ENCOUNTER — Ambulatory Visit: Admitting: Obstetrics and Gynecology

## 2024-03-08 ENCOUNTER — Encounter: Payer: Self-pay | Admitting: Student

## 2024-03-08 ENCOUNTER — Ambulatory Visit (INDEPENDENT_AMBULATORY_CARE_PROVIDER_SITE_OTHER): Admitting: Student

## 2024-03-08 ENCOUNTER — Ambulatory Visit: Payer: Self-pay

## 2024-03-08 ENCOUNTER — Encounter: Payer: Self-pay | Admitting: Family Medicine

## 2024-03-08 ENCOUNTER — Other Ambulatory Visit: Payer: Self-pay | Admitting: Family Medicine

## 2024-03-08 ENCOUNTER — Other Ambulatory Visit (HOSPITAL_BASED_OUTPATIENT_CLINIC_OR_DEPARTMENT_OTHER): Payer: Self-pay

## 2024-03-08 VITALS — BP 114/82 | HR 60 | Temp 97.8°F | Ht 64.0 in | Wt 140.8 lb

## 2024-03-08 DIAGNOSIS — N390 Urinary tract infection, site not specified: Secondary | ICD-10-CM | POA: Diagnosis not present

## 2024-03-08 DIAGNOSIS — R300 Dysuria: Secondary | ICD-10-CM

## 2024-03-08 LAB — POCT URINALYSIS DIPSTICK
Blood, UA: NEGATIVE
Glucose, UA: POSITIVE — AB
Ketones, UA: 15
Nitrite, UA: POSITIVE
Protein, UA: NEGATIVE
Spec Grav, UA: <=1.005 — AB
Urobilinogen, UA: 1 U/dL
pH, UA: 7.5

## 2024-03-08 MED ORDER — NITROFURANTOIN MONOHYD MACRO 100 MG PO CAPS
100.0000 mg | ORAL_CAPSULE | Freq: Two times a day (BID) | ORAL | 0 refills | Status: AC
  Filled 2024-03-08: qty 10, 5d supply, fill #0

## 2024-03-08 MED ORDER — CEPHALEXIN 500 MG PO CAPS: 500.0000 mg | ORAL_CAPSULE | Freq: Two times a day (BID) | ORAL | 0 refills | Status: AC

## 2024-03-08 NOTE — Progress Notes (Signed)
 Chief Complaint  Patient presents with   Urinary Tract Infection    Onset 03/07/2024  Pain with urination  Pt request Rx be sent to the pharmacy downstairs     Sherry Ross is a 69 y.o. female here for possible UTI.  History of Present Illness Sherry Ross is a 69 year old female who presents with recurrent urinary tract infections.  She reports four UTIs in the past year, with the most recent in December, characterized by burning with urination without severe pain. She associates UTIs with sexual intercourse and tries to urinate before and after intercourse. She had a UroGYN visit in January focused on vaginal dryness, and evaluation after prior hysterectomy ruled out bladder prolapse. She uses a water-based lubricant and an over-the-counter supplement for vaginal dryness. Denies new sexual partners. Denies hematuria.   Patient denies fever, chills, SOB, CP, palpitations, dyspnea, edema, HA, vision changes, N/V/D, abdominal pain.  Past Medical History:  Diagnosis Date   Hyperlipidemia    Hypertension      BP 114/82 (BP Location: Left Arm, Patient Position: Sitting, Cuff Size: Normal)   Pulse 60   Temp 97.8 F (36.6 C) (Oral)   Ht 5' 4 (1.626 m)   Wt 140 lb 12.8 oz (63.9 kg)   LMP 02/14/1993   SpO2 99%   BMI 24.17 kg/m  General: Awake, alert, appears stated age Heart: RRR Lungs: CTAB, normal respiratory effort, no accessory muscle usage Abd: BS+, soft, NT, ND, no masses or organomegaly MSK: No CVA tenderness,  Psych: Age appropriate judgment and insight  Dysuria - Plan: POCT Urinalysis Dipstick, Urine Culture, nitrofurantoin , macrocrystal-monohydrate, (MACROBID ) 100 MG capsule  Frequent UTI - Plan: Ambulatory referral to Urology  Recurrent UTIs with post-coital symptoms and vaginal dryness. +Nitrites, +Leuks - Rx- Macrobid  - Urine culture pending - Advised urination post-intercourse, recommended hypoallergenic, water-based lubricants. -Pt request new referral for  Urology, referral placed -Stay hydrated. -Seek immediate care if pt starts to develop fevers, new/worsening symptoms, uncontrollable N/V. F/u prn. The patient voiced understanding and agreement to the plan.  Harlene LITTIE Jolly, DNP, AGNP-C 03/08/24 9:09 AM

## 2024-03-08 NOTE — Telephone Encounter (Signed)
 Appt scheduled

## 2024-03-08 NOTE — Progress Notes (Deleted)
 No chief complaint on file.   Devere Anon here for URI complaints.  Duration: {Numbers; 1-10:13787} {Time; day/wk/mo/yr:20843}  Associated symptoms: {URI Symptoms :210800001} Denies: {URI Symptoms :210800001} Treatment to date: *** Sick contacts: {yes/no:20286}  Past Medical History:  Diagnosis Date   Hyperlipidemia    Hypertension     Objective LMP 02/14/1993  General: Awake, alert, appears stated age HEENT: AT, , ears patent b/l and TM's neg, nares patent w/o discharge, pharynx pink and without exudates, MMM Neck: No masses or asymmetry Heart: RRR Lungs: CTAB, no accessory muscle use Psych: Age appropriate judgment and insight, normal mood and affect  No diagnosis found.  Continue to push fluids, practice good hand hygiene, cover mouth when coughing. F/u prn. If starting to experience fevers, shaking, or shortness of breath, seek immediate care. Pt voiced understanding and agreement to the plan.  Harlene LITTIE Jolly, DNP, AGNP-C 03/08/24 8:10 AM
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
## 2024-03-11 ENCOUNTER — Other Ambulatory Visit: Payer: Self-pay | Admitting: Family Medicine

## 2024-03-11 DIAGNOSIS — E785 Hyperlipidemia, unspecified: Secondary | ICD-10-CM

## 2024-03-12 ENCOUNTER — Ambulatory Visit: Payer: Self-pay | Admitting: Student

## 2024-03-12 LAB — URINE CULTURE
MICRO NUMBER:: 17508009
SPECIMEN QUALITY:: ADEQUATE

## 2024-03-15 ENCOUNTER — Telehealth: Admitting: Obstetrics and Gynecology

## 2024-04-10 ENCOUNTER — Ambulatory Visit: Admitting: Urology

## 2024-06-25 ENCOUNTER — Ambulatory Visit
